# Patient Record
Sex: Male | Born: 1935 | Race: White | Hispanic: No | Marital: Married | Smoking: Former smoker
Health system: Southern US, Community
[De-identification: ages and names within clinical notes are randomized; demographics above are authoritative.]

## PROBLEM LIST (undated history)

## (undated) DIAGNOSIS — M1990 Unspecified osteoarthritis, unspecified site: Secondary | ICD-10-CM

## (undated) DIAGNOSIS — I70209 Unspecified atherosclerosis of native arteries of extremities, unspecified extremity: Secondary | ICD-10-CM

## (undated) DIAGNOSIS — I119 Hypertensive heart disease without heart failure: Secondary | ICD-10-CM

## (undated) DIAGNOSIS — D649 Anemia, unspecified: Secondary | ICD-10-CM

## (undated) DIAGNOSIS — E119 Type 2 diabetes mellitus without complications: Secondary | ICD-10-CM

## (undated) DIAGNOSIS — R0602 Shortness of breath: Secondary | ICD-10-CM

## (undated) DIAGNOSIS — I509 Heart failure, unspecified: Secondary | ICD-10-CM

## (undated) DIAGNOSIS — H3530 Unspecified macular degeneration: Secondary | ICD-10-CM

## (undated) DIAGNOSIS — I219 Acute myocardial infarction, unspecified: Secondary | ICD-10-CM

## (undated) DIAGNOSIS — N184 Chronic kidney disease, stage 4 (severe): Secondary | ICD-10-CM

## (undated) DIAGNOSIS — L98499 Non-pressure chronic ulcer of skin of other sites with unspecified severity: Secondary | ICD-10-CM

## (undated) DIAGNOSIS — I2510 Atherosclerotic heart disease of native coronary artery without angina pectoris: Secondary | ICD-10-CM

## (undated) DIAGNOSIS — K635 Polyp of colon: Secondary | ICD-10-CM

## (undated) DIAGNOSIS — E785 Hyperlipidemia, unspecified: Secondary | ICD-10-CM

## (undated) DIAGNOSIS — E118 Type 2 diabetes mellitus with unspecified complications: Secondary | ICD-10-CM

## (undated) HISTORY — DX: Atherosclerotic heart disease of native coronary artery without angina pectoris: I25.10

## (undated) HISTORY — PX: CAROTID ENDARTERECTOMY: SUR193

## (undated) HISTORY — PX: HERNIA REPAIR: SHX51

## (undated) HISTORY — DX: Anemia, unspecified: D64.9

## (undated) HISTORY — DX: Unspecified macular degeneration: H35.30

## (undated) HISTORY — PX: OTHER SURGICAL HISTORY: SHX169

## (undated) HISTORY — DX: Unspecified osteoarthritis, unspecified site: M19.90

## (undated) HISTORY — DX: Polyp of colon: K63.5

## (undated) HISTORY — DX: Hyperlipidemia, unspecified: E78.5

---

## 1990-12-31 ENCOUNTER — Encounter (INDEPENDENT_AMBULATORY_CARE_PROVIDER_SITE_OTHER): Payer: Self-pay | Admitting: *Deleted

## 1991-01-17 ENCOUNTER — Encounter (INDEPENDENT_AMBULATORY_CARE_PROVIDER_SITE_OTHER): Payer: Self-pay | Admitting: *Deleted

## 1991-11-29 HISTORY — PX: CORONARY ARTERY BYPASS GRAFT: SHX141

## 1997-07-15 ENCOUNTER — Encounter (INDEPENDENT_AMBULATORY_CARE_PROVIDER_SITE_OTHER): Payer: Self-pay | Admitting: *Deleted

## 2002-05-27 ENCOUNTER — Encounter: Payer: Self-pay | Admitting: Internal Medicine

## 2002-06-03 ENCOUNTER — Encounter: Payer: Self-pay | Admitting: Internal Medicine

## 2003-02-20 ENCOUNTER — Ambulatory Visit (HOSPITAL_COMMUNITY): Admission: RE | Admit: 2003-02-20 | Discharge: 2003-02-20 | Payer: Self-pay | Admitting: General Surgery

## 2003-02-20 ENCOUNTER — Encounter (INDEPENDENT_AMBULATORY_CARE_PROVIDER_SITE_OTHER): Payer: Self-pay | Admitting: *Deleted

## 2003-02-20 ENCOUNTER — Encounter (INDEPENDENT_AMBULATORY_CARE_PROVIDER_SITE_OTHER): Payer: Self-pay | Admitting: Specialist

## 2003-07-03 ENCOUNTER — Ambulatory Visit (HOSPITAL_COMMUNITY): Admission: RE | Admit: 2003-07-03 | Discharge: 2003-07-03 | Payer: Self-pay | Admitting: Cardiology

## 2003-07-03 ENCOUNTER — Encounter: Payer: Self-pay | Admitting: Cardiology

## 2003-07-10 ENCOUNTER — Ambulatory Visit (HOSPITAL_COMMUNITY): Admission: RE | Admit: 2003-07-10 | Discharge: 2003-07-11 | Payer: Self-pay | Admitting: Cardiology

## 2004-01-14 ENCOUNTER — Ambulatory Visit (HOSPITAL_COMMUNITY): Admission: RE | Admit: 2004-01-14 | Discharge: 2004-01-14 | Payer: Self-pay | Admitting: Cardiology

## 2004-01-14 ENCOUNTER — Encounter (INDEPENDENT_AMBULATORY_CARE_PROVIDER_SITE_OTHER): Payer: Self-pay | Admitting: *Deleted

## 2004-03-12 ENCOUNTER — Ambulatory Visit (HOSPITAL_COMMUNITY): Admission: RE | Admit: 2004-03-12 | Discharge: 2004-03-13 | Payer: Self-pay | Admitting: Nephrology

## 2005-03-02 ENCOUNTER — Ambulatory Visit: Payer: Self-pay | Admitting: Internal Medicine

## 2005-03-05 ENCOUNTER — Encounter (INDEPENDENT_AMBULATORY_CARE_PROVIDER_SITE_OTHER): Payer: Self-pay | Admitting: *Deleted

## 2005-03-05 ENCOUNTER — Ambulatory Visit (HOSPITAL_COMMUNITY): Admission: RE | Admit: 2005-03-05 | Discharge: 2005-03-05 | Payer: Self-pay | Admitting: Internal Medicine

## 2005-03-29 ENCOUNTER — Ambulatory Visit: Payer: Self-pay | Admitting: Internal Medicine

## 2007-01-25 ENCOUNTER — Encounter: Admission: RE | Admit: 2007-01-25 | Discharge: 2007-01-25 | Payer: Self-pay | Admitting: Nephrology

## 2008-03-22 ENCOUNTER — Emergency Department (HOSPITAL_COMMUNITY): Admission: EM | Admit: 2008-03-22 | Discharge: 2008-03-22 | Payer: Self-pay | Admitting: Emergency Medicine

## 2009-08-17 ENCOUNTER — Emergency Department (HOSPITAL_COMMUNITY): Admission: EM | Admit: 2009-08-17 | Discharge: 2009-08-17 | Payer: Self-pay | Admitting: Emergency Medicine

## 2009-09-02 ENCOUNTER — Encounter: Admission: RE | Admit: 2009-09-02 | Discharge: 2009-09-02 | Payer: Self-pay | Admitting: Nephrology

## 2009-10-27 ENCOUNTER — Encounter (INDEPENDENT_AMBULATORY_CARE_PROVIDER_SITE_OTHER): Payer: Self-pay | Admitting: *Deleted

## 2009-12-01 ENCOUNTER — Ambulatory Visit: Payer: Self-pay | Admitting: Internal Medicine

## 2009-12-01 DIAGNOSIS — R197 Diarrhea, unspecified: Secondary | ICD-10-CM

## 2009-12-02 ENCOUNTER — Ambulatory Visit: Payer: Self-pay | Admitting: Vascular Surgery

## 2009-12-03 DIAGNOSIS — L02619 Cutaneous abscess of unspecified foot: Secondary | ICD-10-CM | POA: Insufficient documentation

## 2009-12-03 DIAGNOSIS — L03119 Cellulitis of unspecified part of limb: Secondary | ICD-10-CM

## 2009-12-09 ENCOUNTER — Ambulatory Visit: Payer: Self-pay | Admitting: Vascular Surgery

## 2010-12-30 NOTE — Procedures (Signed)
Summary: Colonoscopy   Colonoscopy  Procedure date:  03/29/2005  Findings:      Pathology:  Adenomatous polyp.        Location:  [facility].    Colonoscopy  Procedure date:  03/29/2005  Findings:      Pathology:  Adenomatous polyp.        Location:  [facility].   Patient Name: Whitney, Bingaman MRN:  Procedure Procedures: Colonoscopy CPT: 16109.    with polypectomy. CPT: A3573898.  Personnel: Endoscopist: Wilhemina Bonito. Marina Goodell, MD.  Exam Location: Exam performed in Outpatient Clinic. Outpatient  Patient Consent: Procedure, Alternatives, Risks and Benefits discussed, consent obtained, from patient. Consent was obtained by the RN.  Indications  Abnormal Exams, Studies: CT scan,  Surveillance of: POLYPS.  History  Current Medications: Patient is not currently taking Coumadin.  Pre-Exam Physical: Performed Jun 03, 2002. Entire physical exam was normal.  Exam Exam: Extent of exam reached: Cecum, extent intended: Cecum.  The cecum was identified by appendiceal orifice and IC valve. Patient position: on left side. Colon retroflexion performed. Images taken. ASA Classification: II. Tolerance: excellent.  Monitoring: Pulse and BP monitoring, Oximetry used. Supplemental O2 given.  Colon Prep Used MIRALAX for colon prep. Prep results: good.  Sedation Meds: Patient assessed and found to be appropriate for moderate (conscious) sedation. Fentanyl 50 mcg. given IV. Versed 5 mg. given IV.  Findings NORMAL EXAM: Cecum to Rectum.  MULTIPLE POLYPS: Ascending Colon to Transverse Colon. minimum size 2 mm, maximum size 4 mm. Procedure:  snare without cautery, removed, Polyp retrieved, 1 polyps Polyps sent to pathology. ICD9: Colon Polyps: 211.3. Comments: 3 seen and removed.  POLYP: Sigmoid Colon, Maximum size: 5 mm. Procedure:  snare without cautery, removed, retrieved, ICD9: Colon Polyps: 211.3.   Assessment Abnormal examination, see findings above.    Diagnoses: 211.3: Colon Polyps.   Comments: NO WORRISOME LESIONS FOUND Events  Unplanned Interventions: No intervention was required.  Unplanned Events: There were no complications. Plans Disposition: After procedure patient sent to recovery. After recovery patient sent home.  Scheduling/Referral: Follow-Up prn.   This report was created from the original endoscopy report, which was reviewed and signed by the above listed endoscopist.   cc: Geoffry Paradise, MD     The Patient

## 2010-12-30 NOTE — Consult Note (Signed)
Summary: GI Consult/[facility]  GI Consult/ HealthCare   Imported By: Sherian Rein 12/07/2009 09:49:19  _____________________________________________________________________  External Attachment:    Type:   Image     Comment:   External Document

## 2010-12-30 NOTE — Assessment & Plan Note (Signed)
Summary: CONTINUE DIARRHEA..EM   History of Present Illness Visit Type: Initial Consult Primary GI MD: Yancey Flemings MD Primary Provider: Geoffry Paradise, MD Requesting Provider: Geoffry Paradise, MD Chief Complaint: diarrhea and fecal incontinence for 2 months, corrected with align and symptoms have subsided History of Present Illness:   77 year old white male with multiple medical problems including coronary artery disease status post coronary artery bypass grafting, carotid artery disease status post carotid endarterectomy, diabetes mellitus,renovascular disease with renal stents, hyperlipidemia, osteoarthritis, and adenomatous colon polyps. The patient presents today after having had recent problems with diarrhea. He is accompanied by his wife. He tells me that his chronic medical problems have been stable. However, around September, he began to have loose stools. Occasionally unexpected stools with urgency and the risk of incontinence. He was placed on the probiotic align for several weeks without any improvement. In late November he was placed on metronidazole for 10 days. After completing that therapy, his bowel habits return to normal. He has had normal bowel movements since mid December. He denies recent history of antibiotic exposure. No exposure to persons with similar illness or exotic travel. No stool studies performed. Review of outside laboratories from September reveals a normal CBC and comprehensive metabolic panel as well as urinalysis. Prior upper endoscopy in May of 2006 was normal prior colonoscopy in May of 2006 revealed diminutive polyps only. The only other complaint is that of a red right foot of one month's duration. No pain.Marland Kitchen   GI Review of Systems      Denies abdominal pain, acid reflux, belching, bloating, chest pain, dysphagia with liquids, dysphagia with solids, heartburn, loss of appetite, nausea, vomiting, vomiting blood, weight loss, and  weight gain.      Reports  change in bowel habits and  diarrhea.     Denies anal fissure, black tarry stools, constipation, diverticulosis, fecal incontinence, heme positive stool, hemorrhoids, irritable bowel syndrome, jaundice, light color stool, liver problems, rectal bleeding, and  rectal pain. Preventive Screening-Counseling & Management  Alcohol-Tobacco     Smoking Status: quit      Drug Use:  no.      Current Medications (verified): 1)  Amlodipine Besylate 10 Mg Tabs (Amlodipine Besylate) .... Once Daily 2)  Plavix 75 Mg Tabs (Clopidogrel Bisulfate) .Marland Kitchen.. 1 Tablet By Mouth Once Daily 3)  Tricor 145 Mg Tabs (Fenofibrate) .Marland Kitchen.. 1 Tablet By Mouth Once Daily 4)  Furosemide 20 Mg Tabs (Furosemide) .Marland Kitchen.. 1 Tablet By Mouth Once Daily 5)  Lexapro 10 Mg Tabs (Escitalopram Oxalate) .... Once Daily 6)  Ambien Cr 12.5 Mg Cr-Tabs (Zolpidem Tartrate) .... At Bedtime As Needed For Sleep 7)  Betimol 0.5 % Soln (Timolol) .... Use As Directed 8)  Aspirin 81 Mg Tbec (Aspirin) .Marland Kitchen.. 1 Tablet By Mouth Once Daily 9)  Novolog Mix 70/30 70-30 % Susp (Insulin Aspart Prot & Aspart) .... Inject 30 Units  Every Morning and 28 Units Every Evening 10)  Humalog 100 Unit/ml Soln (Insulin Lispro (Human)) .... Inject 8 Units At Lunchtime 11)  Allopurinol 100 Mg Tabs (Allopurinol) .... Once Daily 12)  Propoxacet-N 100-650 Mg Tabs (Propoxyphene N-Apap) .... As Needed 13)  Zemplar 1 Mcg Caps (Paricalcitol) .... Once Daily 14)  Icaps  Caps (Multiple Vitamins-Minerals) .... Take 2 Tablets Daily  Allergies (verified): No Known Drug Allergies  Past History:  Past Medical History: Reviewed history from 11/30/2009 and no changes required. Adenomatous Colon Polyps Chronic Kidney Disease Coronary Artery Disease Diabetes Osteoarthritis Hyperlipidemia  Past Surgical History: Reviewed history from  11/30/2009 and no changes required. CABG Rt. Carotid Endarterectomy  Family History: Family History of Diabetes: Brother Family History of Heart  Disease: Father?  Social History: Occupation: Retired Patient is a former smoker.  Alcohol Use - no Daily Caffeine Use Illicit Drug Use - no Smoking Status:  quit Drug Use:  no  Review of Systems       hearing problems, sleeping problems, excessive thirst, shortness of breath, fatigue. All other systems reviewed and reported as negative  Vital Signs:  Patient profile:   77 year old male Height:      72 inches Weight:      173 pounds BMI:     23.55 Pulse rate:   64 / minute Pulse rhythm:   regular BP sitting:   114 / 56  (left arm) Cuff size:   regular  Vitals Entered By: June McMurray CMA Duncan Dull) (December 01, 2009 9:35 AM)  Physical Exam  General:  Well developed, well nourished, no acute distress. Head:  Normocephalic and atraumatic. Eyes:  PERRLA, no icterus. Nose:  No deformity, discharge,  or lesions. Mouth:  No deformity or lesions,  Neck:  Supple; no masses or thyromegaly. Lungs:  Clear throughout to auscultation. Heart:  Regular rate and rhythm; no murmurs, rubs,  or bruits. Abdomen:  Soft, nontender and nondistended. No masses, hepatosplenomegaly or hernias noted. Normal bowel sounds. Msk:  hammertoes. Normal posture. Pulses:  Normal pulses noted. Extremities:  No clubbing, cyanosis, edema or deformities noted.. The right foot is erythematous and warm consistent with cellulitis. No tenderness Neurologic:  Alert and  oriented x4;  grossly normal neurologically. Skin:  Intact without significant lesions or rashes except for the skin over the right foot as described Psych:  Alert and cooperative. Normal mood and affect.   Impression & Recommendations:  Problem # 1:  DIARRHEA (ICD-787.91) the patient had previous problems with several weeks of diarrhea and occasional incontinence. He seems to have responded to a course of metronidazole. Unclear if that was cause and effect or coincidental. As such, I suspect that his diarrhea was related to either a self-limited  viral gastroenteritis or a metronidazole responsive disorder such as Giardia or community acquired C.difficile. In any event, he has been asymptomatic for 3 weeks. At this point, further needs to be done. We will observe him for any evidence of recurrence. If recurrence occurs, then consider additional workup. He will return to the care of Dr. Jacky Kindle.  Problem # 2:  CELLULITIS, FOOT, RIGHT (ICD-682.7) patient appears to have a cellulitis on his right foot. I would like him to see Dr. Jacky Kindle this week. There are agreeable we'll contact the office today.  Patient Instructions: 1)  Please schedule a follow-up appointment as needed.  2)  The medication list was reviewed and reconciled.  All changed / newly prescribed medications were explained.  A complete medication list was provided to the patient / caregiver. 3)  Copy: Dr. Geoffry Paradise

## 2010-12-30 NOTE — Procedures (Signed)
Summary: Colonoscopy/[facility]  Colonoscopy/[facility]   Imported By: Sherian Rein 12/07/2009 09:43:21  _____________________________________________________________________  External Attachment:    Type:   Image     Comment:   External Document

## 2010-12-30 NOTE — Progress Notes (Signed)
Summary: [facility]   Imported By: Sherian Rein 12/07/2009 09:47:05  _____________________________________________________________________  External Attachment:    Type:   Image     Comment:   External Document

## 2010-12-30 NOTE — Procedures (Signed)
Summary: Colonoscopy   Colonoscopy  Procedure date:  06/03/2002  Findings:      Results: Normal. Location:  [facility].   Patient Name: [name], [name] MRN:  Procedure Procedures: Colonoscopy CPT: 04540.  Personnel: Endoscopist: Wilhemina Bonito. Marina Goodell, MD.  Referred By: Pearletha Furl Jacky Kindle, MD.  Exam Location: Exam performed in Outpatient Clinic. Outpatient  Patient Consent: Procedure, Alternatives, Risks and Benefits discussed, consent obtained, from patient. Consent was obtained by the RN.  Indications  Surveillance of: polyps.  History  Pre-Exam Physical: Performed Jun 03, 2002. Entire physical exam was normal.  Exam Exam: Extent of exam reached: Terminal Ileum, extent intended: Terminal Ileum.  The cecum was identified by appendiceal orifice and IC valve. Patient position: on left side. Colon retroflexion performed. Images taken. ASA Classification: III. Tolerance: excellent.  Monitoring: Pulse and BP monitoring, Oximetry used. Supplemental O2 given.  Colon Prep Used Golytely for colon prep. Prep results: excellent.  Sedation Meds: Patient assessed and found to be appropriate for moderate (conscious) sedation. Fentanyl 50 mcg. given IV. Versed 3 mg. given IV.  Findings NORMAL EXAM: Ileum to Rectum.   Assessment Normal examination.  Events  Unplanned Interventions: No intervention was required.  Unplanned Events: There were no complications. Plans Disposition: After procedure patient sent to recovery. After recovery patient sent home.  Scheduling/Referral: Follow-Up prn.  Comments: Return to the care of Dr. Jacky Kindle.  This report was created from the original endoscopy report, which was reviewed and signed by the above listed endoscopist.   cc:  Geoffry Paradise, MD

## 2010-12-30 NOTE — Op Note (Signed)
Summary: Operative Report    NAME:  [name], [name]                           ACCOUNT NO.:  [id number]   MEDICAL RECORD NO.:  [id number]                   PATIENT TYPE:  AMB   LOCATION:  DAY                                  FACILITY:  [facility]   PHYSICIAN:  Timothy E. Earlene Plater, M.D.              DATE OF BIRTH:  1927/10/17   DATE OF PROCEDURE:  02/20/2003  DATE OF DISCHARGE:                                 OPERATIVE REPORT   PREOPERATIVE DIAGNOSIS:  Right inguinal hernia.   POSTOPERATIVE DIAGNOSIS:  Right indirect inguinal hernia.   OPERATIVE PROCEDURE:  Repair of hernia with mesh.   SURGEON:  Timothy E. Earlene Plater, M.D.   ANESTHESIA:  General.   INDICATIONS FOR PROCEDURE:  Mr. Bee is 46, has significant but controlled  diabetes and cardiovascular disease.  He has an increasing, uncomfortable,  larger, right inguinal hernia and after careful discussion, he wishes to  have the repair.  He was seen, identified, the site marked, and the permit  signed.  He was evaluated by anesthesia.   DESCRIPTION OF PROCEDURE:  He was taken to the operating room, placed  supine, LMA anesthesia provided.  The right groin had been shaved.  It was  prepped and draped in the usual fashion.  Marcaine 25% with epinephrine was  used throughout the wound.  A horizontal incision made in the right groin,  the subcutaneous tissue dissected and retracted, the external oblique  identified, opened in line with its fibers to the external ring.  This too,  was retracted.  The cord structures were quite large, were dissected from  the floor of the canal.  The floor of the canal was weak but not herniated.  The cord contained a large indirect hernia sac.  There were no contents at  this time.  The sac was dissected from the cord structures back to its neck  at the internal ring, was ligated with a 0 Prolene, excess sac cut away and  submitted for path.  A plug of mesh was placed in the internal ring, sutured  to the  surrounding fascia.  A patch of mesh was placed over the floor of the  canal, under the cord, and around the ring, and sewn with 2-0 running  Prolene.  This completed the repair.  The cord was replaced in its anatomic  position.  Bleeding had been controlled.  The external oblique was closed  with a running 2-0 Vicryl.  Deep subcu 2-0 Vicryl,  skin 3-0 Monocryl.  Steri-Strips and dry sterile dressing applied, counts  correct.  He tolerated it well, was awakened, and taken to the recovery room  in good condition.   Written and verbal instructions including Vicodin #36 were given, and he  will be seen and followed as an outpatient.  Timothy E. Earlene Plater, M.D.    TED/MEDQ  D:  02/20/2003  T:  02/20/2003  Job:  [id number]   cc:   Geoffry Paradise, M.D.  [address]  [place]  [place] [address]  Fax: [phone]

## 2010-12-30 NOTE — Progress Notes (Signed)
Summary: GI Dietitian  GI Office Note/[facility]   Imported By: Sherian Rein 12/07/2009 09:51:29  _____________________________________________________________________  External Attachment:    Type:   Image     Comment:   External Document

## 2010-12-30 NOTE — Procedures (Signed)
Summary: EGD   EGD  Procedure date:  03/29/2005  Findings:      Findings: Normal    EGD  Procedure date:  03/29/2005  Findings:      Findings: Normal   Patient Name: Ozro, Russett MRN:  Procedure Procedures: Panendoscopy (EGD) CPT: 43235.  Personnel: Endoscopist: Wilhemina Bonito. Marina Goodell, MD.  Exam Location: Exam performed in Outpatient Clinic. Outpatient  Patient Consent: Procedure, Alternatives, Risks and Benefits discussed, consent obtained, from patient. Consent was obtained by the RN.  Indications Symptoms: Abdominal pain,  History  Current Medications: Patient is not currently taking Coumadin.  Pre-Exam Physical: Performed Mar 29, 2005  Entire physical exam was normal.  Exam Exam Info: Maximum depth of insertion Duodenum, intended Duodenum. Patient position: on left side. Vocal cords visualized. Gastric retroflexion performed. Images taken. ASA Classification: II. Tolerance: excellent.  Sedation Meds: Patient assessed and found to be appropriate for moderate (conscious) sedation. Residual sedation present from prior procedure today. Fentanyl 25 mcg. given IV. Versed 2 mg. given IV.  Monitoring: BP and pulse monitoring done. Oximetry used. Supplemental O2 given  Findings Normal: Proximal Esophagus to Duodenal 2nd Portion. Comments: SMALL H/H; J SHAPED STOMACH.   Assessment Normal examination.  Events  Unplanned Intervention: No unplanned interventions were required.  Unplanned Events: There were no complications. Plans Disposition: After procedure patient sent to recovery. After recovery patient sent home.  Scheduling: Follow-up prn.  Comments: RETURN TO THE CARE OF DR ARONSON  This report was created from the original endoscopy report, which was reviewed and signed by the above listed endoscopist.   cc:  Geoffry Paradise, MD      The Patient

## 2011-03-04 LAB — CBC
Hemoglobin: 13.4 g/dL (ref 13.0–17.0)
MCHC: 34.5 g/dL (ref 30.0–36.0)
RBC: 3.97 MIL/uL — ABNORMAL LOW (ref 4.22–5.81)

## 2011-03-04 LAB — GLUCOSE, CAPILLARY
Glucose-Capillary: 120 mg/dL — ABNORMAL HIGH (ref 70–99)
Glucose-Capillary: 179 mg/dL — ABNORMAL HIGH (ref 70–99)

## 2011-03-04 LAB — POCT I-STAT, CHEM 8
BUN: 62 mg/dL — ABNORMAL HIGH (ref 6–23)
Calcium, Ion: 1.12 mmol/L (ref 1.12–1.32)
Chloride: 103 mEq/L (ref 96–112)
Glucose, Bld: 58 mg/dL — ABNORMAL LOW (ref 70–99)

## 2011-03-04 LAB — URINALYSIS, ROUTINE W REFLEX MICROSCOPIC
Nitrite: NEGATIVE
Specific Gravity, Urine: 1.011 (ref 1.005–1.030)
Urobilinogen, UA: 0.2 mg/dL (ref 0.0–1.0)

## 2011-03-04 LAB — DIFFERENTIAL
Lymphocytes Relative: 13 % (ref 12–46)
Monocytes Absolute: 0.7 10*3/uL (ref 0.1–1.0)
Monocytes Relative: 10 % (ref 3–12)
Neutro Abs: 5.3 10*3/uL (ref 1.7–7.7)

## 2011-03-04 LAB — POCT CARDIAC MARKERS: Troponin i, poc: <0.05 ng/mL (ref 0.00–0.09)

## 2011-04-12 NOTE — Assessment & Plan Note (Signed)
OFFICE VISIT   [name], [name]  DOB:  [birth year]                                       12/02/2009  [id number]#:[id number]   CHIEF COMPLAINT:  Redness in right foot.   HISTORY OF PRESENT ILLNESS:  The patient is an 77 year old male referred  by Dr. Jacky Kindle for evaluation of redness in his right foot.  This has  been present for approximately 2 weeks.  The patient has a history of  gout in the past and he states it  looked similar to this but this time  it is not painful and in the past, his gout had been painful.  He does  not describe any claudication symptoms.  He has no history of nonhealing  wounds on his feet.  He has no history of rest pain.   CHRONIC MEDICAL PROBLEMS:  Include coronary artery disease, diabetes and  hypertension.  All these are currently well controlled.   Of note, he has been on Plavix for several years.   PAST SURGICAL HISTORY:  Coronary artery bypass grafting.  He had  coronary stenting by Dr. Donnie Aho several years ago.   PAST MEDICAL HISTORY:  Otherwise is as listed above.   FAMILY HISTORY:  Unremarkable.   SOCIAL HISTORY:  He is married and has 4 children.  He is retired.  He  is a former smoker, quit 30 years ago.   A full 12 point review of systems was performed with the patient.  Please see intake referral form for details regarding this.   PHYSICAL EXAMINATION:  Vital signs:  Blood pressure 128/68  in the left  arm, heart rate 62 and regular, respirations 16.  HEENT:  Unremarkable.  Neck:  Has 2+ carotid pulses without bruit.  Chest:  Clear to  auscultation.  Heart:  Regular rate rhythm without murmur.  Abdomen:  Soft, nontender, nondistended.  No masses.  Extremities:  He has 2+  radial, 1+ femoral pulses bilaterally.  He has absent popliteal and  pedal pulses bilaterally.  He has well-healed saphenectomy scars in the  calf area on both legs.  He has a well-healed right neck carotid  endarterectomy scar.  Neurologic:  Exam  shows symmetric upper extremity  and lower extremity motor strength with no focal deficit.  Musculoskeletal:  He has no major joint deformities.  Skin:  Has no  ulcers or rashes.  However, there is some erythema over the right first  metatarsal area extending onto the dorsum of the foot.  There is some  pain on palpation of the first metatarsal head in the right forefoot.   He has no significant lymphadenopathy in the groin or cervical area.  The area of erythema on his foot is approximately 7 x 5 cm in diameter.   The patient had bilateral ABIs today which were 0.58 on the right and  1.04 on the left.  Waveforms were monophasic on the right, biphasic on  the left.  Duplex ultrasound showed a greater than 75% stenosis of the  right superficial femoral artery.   In summary, the patient has history of erythema in his right foot that  is not really  painful except on palpation.  He currently has been  placed on Keflex and colchicine by Dr. Jacky Kindle  for a possible gout  flare-up.  I agree with Dr. Jacky Kindle that certainly could  be the case.  However, I believe he warrants close follow-up and  I have scheduled him  for an office visit next week to make sure that his symptoms overall  were improving.  If not we can consider an arteriogram to further  evaluate his right lower extremity arterial tree.  If this heals  spontaneously I do not believe he needs further intervention since  essentially he is asymptomatic from his lower extremities except for  this area of erythema.     Janetta Hora. Fields, MD  Electronically Signed   CEF/MEDQ  D:  12/04/2009  T:  12/04/2009  Job:  [id number]   cc:   Geoffry Paradise, M.D.

## 2011-04-12 NOTE — Procedures (Signed)
LOWER EXTREMITY ARTERIAL DUPLEX   INDICATION:  Right lower extremity pain and redness.   HISTORY:  Diabetes:  Yes.  Cardiac:  Yes.  Hypertension:  No.  Smoking:  Quit about 30 years.  Previous Surgery:   SINGLE LEVEL ARTERIAL EXAM                          RIGHT                LEFT  Brachial:               132                  135  Anterior tibial:        67                   141  Posterior tibial:       78                   78  Peroneal:  Ankle/Brachial Index:   0.58                 1.04   LOWER EXTREMITY ARTERIAL DUPLEX EXAM   DUPLEX:  High-grade stenosis noted in the mid-to-distal thigh with  questionable occlusion at the distal thigh in the right leg.   IMPRESSION:  1. High-grade stenosis noted at the mid-to-distal thigh of the right      leg with questionable occlusion at the distal thigh to the      popliteal area.  2. Moderately abnormal ankle brachial index with monophasic Doppler      waveforms noted in the right leg.  3. Normal ankle brachial index with biphasic Doppler waveforms noted      in the left dorsalis pedis artery.   ___________________________________________  Janetta Hora Fields, MD   MG/MEDQ  D:  12/02/2009  T:  12/02/2009  Job:  [id number]

## 2011-04-12 NOTE — Assessment & Plan Note (Signed)
OFFICE VISIT   [name], [name]  DOB:  Feb 28, 1927                                       12/09/2009  [id number]#:[id number]   The patient returns for followup today.  He was seen 1 week ago with  erythema and tenderness over his left first metatarsal head.  He  presents today for further followup.  He is currently on Keflex.  He  also takes chronically allopurinol.  He apparently is now off  colchicine.   He states he has had no fevers.  He states that overall the foot has  less pain.  He states in the last few days the redness has started to  resolve.   PHYSICAL EXAM:  Temperature is 97.8, blood pressure 115/65 in the left  arm, heart rate is 83 and regular.  The right foot has decreased  erythema.  It is not as intense in character.  He has no tenderness  whatsoever over the metatarsal head at this point.   Overall the patient has improved significantly.  I believe that most  likely this represented a gout flareup rather than necessarily an  infectious process just because the tenderness in the metatarsal head  has resolved so quickly.  He will follow up with [place] in six months' time  for repeat ABIs or sooner if the foot begins to worsen again.     Janetta Hora. Fields, MD  Electronically Signed   CEF/MEDQ  D:  12/09/2009  T:  12/10/2009  Job:  [id number]   cc:   Geoffry Paradise, M.D.

## 2011-04-15 NOTE — Cardiovascular Report (Signed)
[name], [name]                           ACCOUNT NO.:  [id number]   MEDICAL RECORD NO.:  [id number]                   PATIENT TYPE:  OIB   LOCATION:  2901                                 FACILITY:  [facility]   PHYSICIAN:  W. Ashley Royalty., M.D.         DATE OF BIRTH:  [birth year]   DATE OF PROCEDURE:  07/10/2003  DATE OF DISCHARGE:                              CARDIAC CATHETERIZATION   HISTORY:  A 77 year old male with history of coronary artery bypass grafting  who had a severe ostial stenosis noted in the graft to the right coronary  artery which was a sequential graft.  After consideration of options with  patent internal mammary grafts and a diseased vein graft to the obtuse  marginal, it was opted to go with angioplasty and stenting of the vein graft  to the right coronary artery.   DESCRIPTION OF PROCEDURE:  The patient was brought to the catheterization  laboratory and was prepped and draped in the usual manner.  He had been  premedicated with Mucomyst and hydration.  A 7French sheath was placed in  his right femoral artery percutaneously.  Angiograms were made using a 7  [place] JR-4 bypass catheter. Angiomax was administered with bolus and  infusion.  I initially tried to place the filter wire down the artery, but  could not get the vein shaft to the filter wire to pass.  A 0.014 guide wire  was passed across the lesion and the lesion was predominant with a 2.0 x 15-  mm CrossSail balloon.  The filter wire was then able to be deployed in a  good position.  There was some slight malposition due to the large size of  the vein graft.  A 5.0 x 18-mm Ultra stent was then deployed trying to get  the stent outside the ostium.  When the stent was deployed, it slipped back  just a little bit.  There was some under expansion of the stent balloon at  the lesion and the lesion was post dilated with a 5.0 Quantum Maverick to 18  atmospheres with good expansion.  There was some  residual eccentric stenosis  noted in the RAO view, but it was elected with good lesion caliber to leave  this alone.  He did have some bradycardia hypotension after the final  balloon inflations.  The filter wire was removed, nitroglycerin was stopped.  He was begun on some dopamine and nitroglycerin.  His blood pressure  improved and his TIMI flow improved following this.  Post dilatation  angiograms were made and the sheath was sutured in place and he was returned  to the transitional care unit in stable condition.   ANGIOGRAPHIC DATA:  Right coronary graft pre dilatation shows an eccentric  severe 99% ostial stenosis followed by severe 90% proximal stenosis.  The  body of the graft has irregularity and has three termination sites in the  acute marginal,  posterior descending and posterior lateral branch.  Post  dilatation angiograms reveal 20% stenosis which is eccentric.    IMPRESSION:  Successful stenting of the vein graft to the right coronary  artery using a filter wire with stenosis going from 99% to 90% to 20%.                                                [name]., M.D.    WST/MEDQ  D:  07/10/2003  T:  07/10/2003  Job:  [id number]   cc:   Geoffry Paradise, M.D.  [address]  [place]  [place] [address]  Fax: [phone]

## 2011-04-15 NOTE — Op Note (Signed)
[name], [name]                           ACCOUNT NO.:  [id number]   MEDICAL RECORD NO.:  [id number]                   PATIENT TYPE:  AMB   LOCATION:  DAY                                  FACILITY:  [facility]   PHYSICIAN:  Timothy E. Earlene Plater, M.D.              DATE OF BIRTH:  [birth year]   DATE OF PROCEDURE:  02/20/2003  DATE OF DISCHARGE:                                 OPERATIVE REPORT   PREOPERATIVE DIAGNOSIS:  Right inguinal hernia.   POSTOPERATIVE DIAGNOSIS:  Right indirect inguinal hernia.   OPERATIVE PROCEDURE:  Repair of hernia with mesh.   SURGEON:  Timothy E. Earlene Plater, M.D.   ANESTHESIA:  General.   INDICATIONS FOR PROCEDURE:  [name] is 48, has significant but controlled  diabetes and cardiovascular disease.  He has an increasing, uncomfortable,  larger, right inguinal hernia and after careful discussion, he wishes to  have the repair.  He was seen, identified, the site marked, and the permit  signed.  He was evaluated by anesthesia.   DESCRIPTION OF PROCEDURE:  He was taken to the operating room, placed  supine, LMA anesthesia provided.  The right groin had been shaved.  It was  prepped and draped in the usual fashion.  Marcaine 25% with epinephrine was  used throughout the wound.  A horizontal incision made in the right groin,  the subcutaneous tissue dissected and retracted, the external oblique  identified, opened in line with its fibers to the external ring.  This too,  was retracted.  The cord structures were quite large, were dissected from  the floor of the canal.  The floor of the canal was weak but not herniated.  The cord contained a large indirect hernia sac.  There were no contents at  this time.  The sac was dissected from the cord structures back to its neck  at the internal ring, was ligated with a 0 Prolene, excess sac cut away and  submitted for path.  A plug of mesh was placed in the internal ring, sutured  to the surrounding fascia.  A patch of  mesh was placed over the floor of the  canal, under the cord, and around the ring, and sewn with 2-0 running  Prolene.  This completed the repair.  The cord was replaced in its anatomic  position.  Bleeding had been controlled.  The external oblique was closed  with a running 2-0 Vicryl.  Deep subcu 2-0 Vicryl,  skin 3-0 Monocryl.  Steri-Strips and dry sterile dressing applied, counts  correct.  He tolerated it well, was awakened, and taken to the recovery room  in good condition.   Written and verbal instructions including Vicodin #36 were given, and he  will be seen and followed as an outpatient.  Timothy E. Earlene Plater, M.D.    TED/MEDQ  D:  02/20/2003  T:  02/20/2003  Job:  [id number]   cc:   Geoffry Paradise, M.D.  [address]  [place]  [place] [address]  Fax: [phone]

## 2011-04-15 NOTE — Cardiovascular Report (Signed)
[name], [name]                           ACCOUNT NO.:  [id number]   MEDICAL RECORD NO.:  [id number]                   PATIENT TYPE:  OIB   LOCATION:  2852                                 FACILITY:  [facility]   PHYSICIAN:  [name]., M.D.         DATE OF BIRTH:  [birth year]   DATE OF PROCEDURE:  07/03/2003  DATE OF DISCHARGE:                              CARDIAC CATHETERIZATION   HISTORY:  This is a 77 year old male with previous coronary artery bypass  graft who has an abnormal Cardiolite scan and who has also had chest  discomfort.  Study is done to evaluate graft patency.   PROCEDURE:  Procedure was done through the right groin percutaneously  through a single anterior wall needle stick.  Grafts were selected using  right coronary catheter for the vein grafts and internal mammary artery  catheters required to select the internal mammary graft.  He tolerated the  procedure well.  He received labetalol 20 mg IV at the end of the case for  blood pressure control.   HEMODYNAMIC DATA:  1. Aorta post contrast 174/77.  2. LV post contrast 174/13-20.   ANGIOGRAPHIC DATA:  LEFT VENTRICULOGRAM: Performed in the 30 degree RAO  projection.  The aortic valve was normal.  The mitral valve was appeared normal and there mild mitral regurgitation  present.  The left ventricle was normal in size.  There was severe hypokinesis at the  inferior wall with estimated an ejection fraction of around 35-40%.  Coronary arteries arise and distribute normally.  There is calcification  noted in the left and the right coronary artery system.  Left main coronary artery is normal.  Left anterior descending has a severe 90% stenosis prior to septal  perforator and is then occluded.  It fills by means of patent internal  mammary graft.  Circumflex coronary artery: Severely diseased and occluded proximally.  Right coronary artery is occluded proximally.  Saphenous vein graft to the circumflex  marginal and distal circumflex is  patent.  There is an eccentric 40% proximal stenosis and moderate  irregularity in the body of the graft.  Distally, there is an eccentric 70%  somewhat ulcerated stenosis prior to the insertion site.  There did not  appear to be enough room for distal protection.  Saphenous vein graft sequentially to the acute marginal, posterior  descending, and posterolateral branch: There is a severe eccentric 90%  ostial stenosis followed by an area of segmental narrowing of 80-90% in the  proximal portion of the graft.  The graft is a large vessel of approximately  5 mm.  The internal mammary graft to the LAD and diagonal is widely patent.  The  diagonal and LAD after the insertion sites are small and diffusely diseased.   IMPRESSION:  1. Severe saphenous vein graft involving the ostium of the graft to the     right coronary artery and moderately severe disease  in the distal portion     of the vein graft to the circumflex marginal branch with occlusion of the     distal end to the circumflex.  2. Patent internal mammary grafts to the left anterior descending and     diagonal with some diffuse distal disease.  3. Severe native three vessel coronary artery disease.  4. Severe hypokinesis of the inferior wall with estimated ejection fraction     of 35-40%.   RECOMMENDATIONS:  Very complex stenosis of diffuse disease.  Consideration  of stenting at the ostium of the vein graft to the right coronary artery  versus redo bypass grafting.  He has renal insufficiency and will be allowed  to recover from the contrast used for the catheterization.                                               [name]., M.D.    WST/MEDQ  D:  07/03/2003  T:  07/03/2003  Job:  [id number]   cc:   [name], M.D.  [address]  [place]  [place] [address]  Fax: [phone]

## 2011-04-15 NOTE — Discharge Summary (Signed)
[name], [name]                           ACCOUNT NO.:  [id number]   MEDICAL RECORD NO.:  [id number]                   PATIENT TYPE:  OIB   LOCATION:  2901                                 FACILITY:  [facility]   PHYSICIAN:  Darden Palmer., M.D.         DATE OF BIRTH:  02/14/1927   DATE OF ADMISSION:  07/10/2003  DATE OF DISCHARGE:  07/11/2003                                 DISCHARGE SUMMARY   FINAL DIAGNOSES:  1. Coronary artery disease involving the bypass graft to the right coronary     artery.     A. Successful stenting of the ostium of this graft.     B. Residual disease involving graft to the circumflex, which is        nonsuitable for intervention, will be treated medically.     C. Patent internal mammary graft sequentially to diagonal and left        anterior descending artery.  2. Type 2 diabetes.  3. Hypertension.  4. Peripheral vascular disease.  5. Renal insufficiency.   PROCEDURES:  Stenting of vein graft to right coronary artery.   HISTORY:  The patient is a 77 year old male who has a previous abnormal  Cardiolite and had demonstrated disease involving the ostium of the vein  graft to the right coronary artery.  Because his mammary graft was patent,  it was opted to do intervention on the vein graft to the right coronary  artery and treat the circumflex graft medically.  He was admitted at this  time for percutaneous treatment of this.  Please see the previous typed  history and physical for many of the details.   HOSPITAL COURSE:  Patient was brought in same day and had hydration  initiated and was given Mucomyst because of his renal insufficiency.  He was  taken to the catheterization laboratory and had stenting of the vein graft  to the right coronary artery with a 5.0 x 18.0 mm Ultra bare metal stent.  He tolerated the procedure well.  The procedure was done with distal  protection using a filter wire.  The procedure is described in detail on the  catheterization report.  He had some mild nausea that afternoon after the  procedure.  His sheath was removed without complications.  He was hydrated  overnight.  His renal function remains stable with a BUN of 36 and  creatinine of 1.8 the next day.  He had good urine output and was discharged  in improved condition.   DISCHARGE MEDICATIONS:  1. Aspirin 81 mg q.d.  2. Plavix 75 mg q.d.  3. ColBenemid 500 mg q.d.  4. Lipitor 10 mg q.d.  5. Diovan 320 mg q.d.  6. Micronase 10 mg q.d.  7. Nexium 40 mg q.d.  8. Norvasc 10 mg q.d.  9. Toprol XL 25 mg q.d.   He is to exercise regularly, is to walk and it is to  be considered for the  cardiac rehab program.  He is to call if there are further problems.  He  will be seen in the office on Tuesday for a repeat renal panel and is  encouraged fluid intake over the weekend.                                                  Darden Palmer., M.D.    WST/MEDQ  D:  07/11/2003  T:  07/11/2003  Job:  [id number]   cc:   Geoffry Paradise, M.D.  [address]  [place]  [place] [address]  Fax: [phone]

## 2011-10-06 IMAGING — CR DG CHEST 2V
2 series · 2 of 2 positions shown · non-contrast
Comparison: 01/25/2007.

CLINICAL DATA: Crackles at the right lung base.

CHEST - 2 VIEW

[w chest pa]
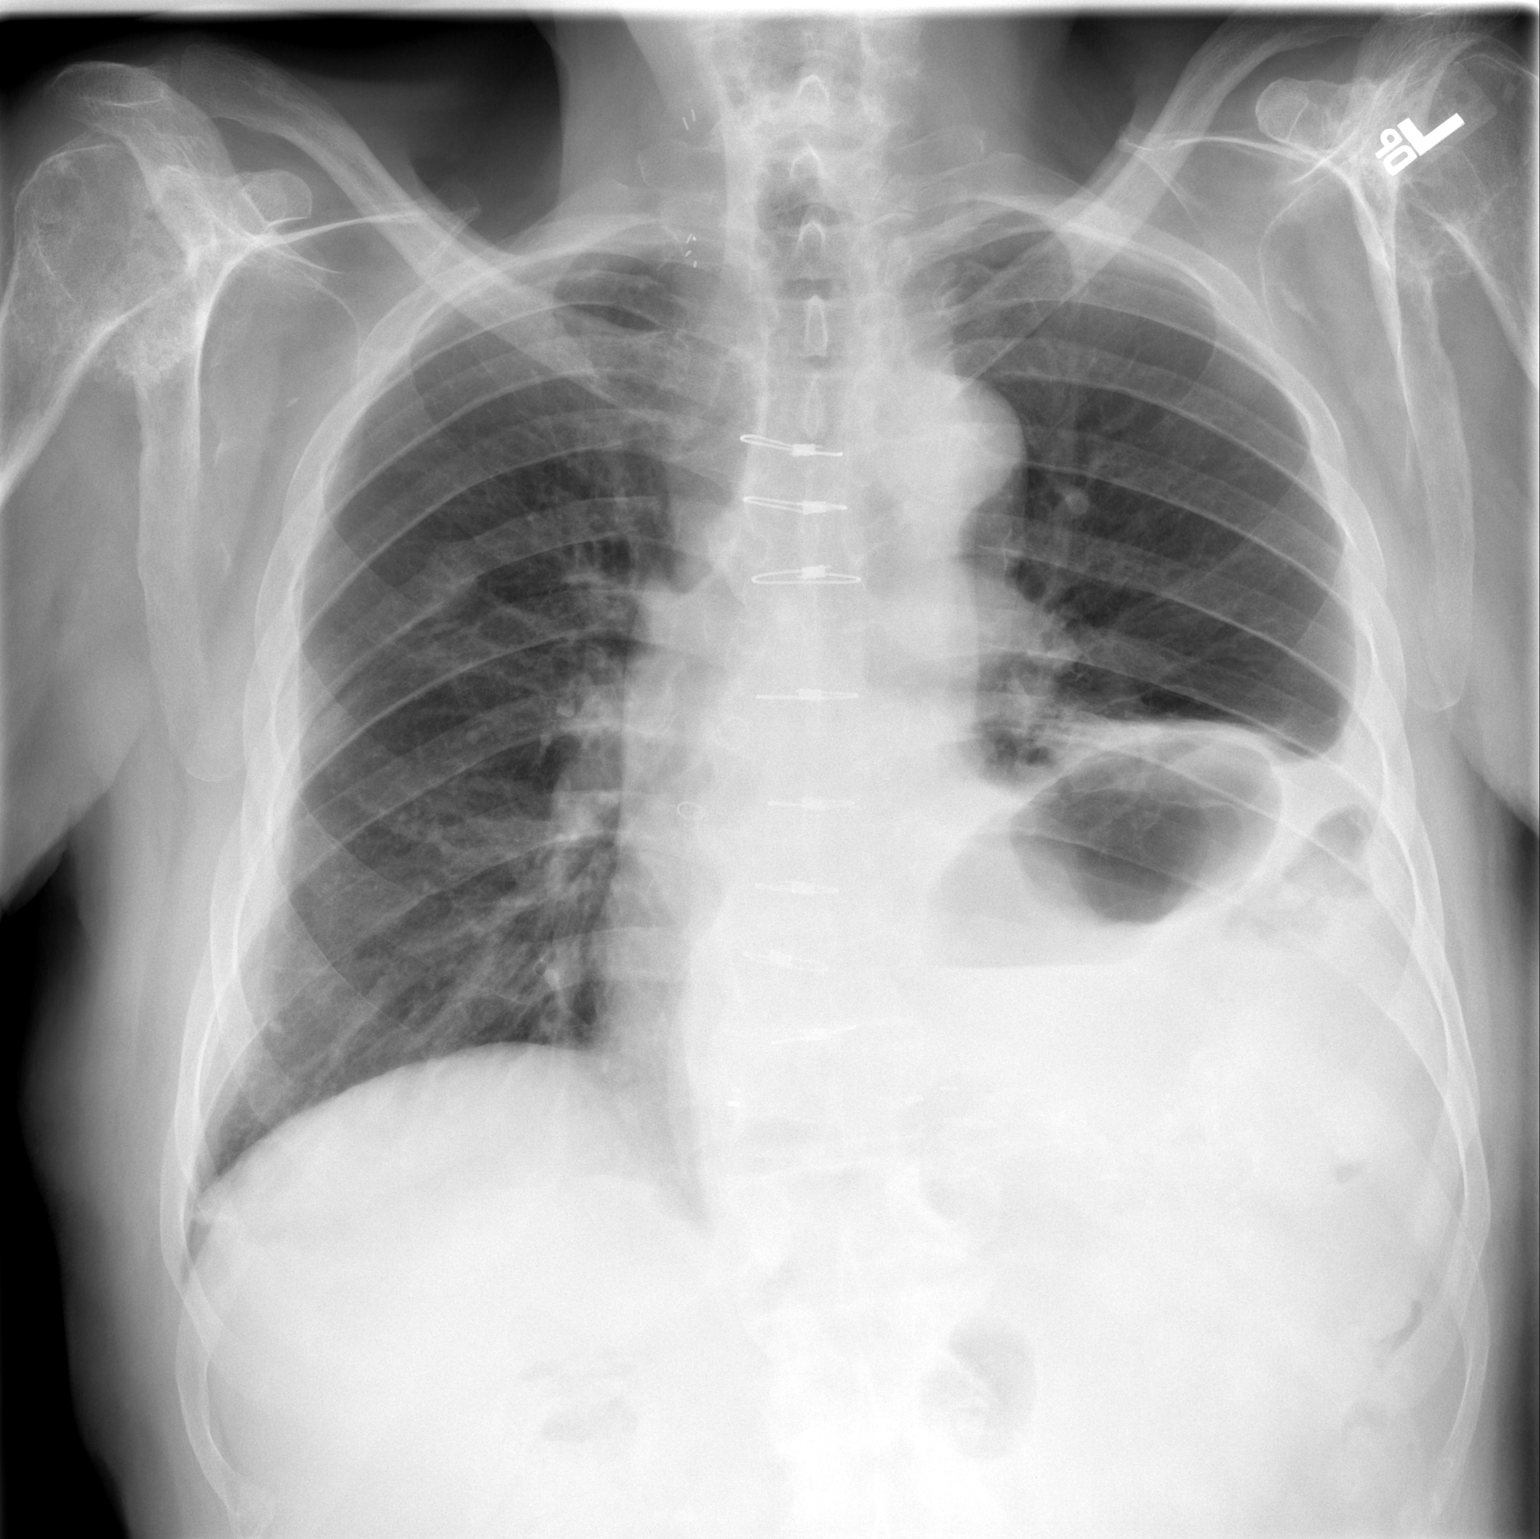

[w chest lat]
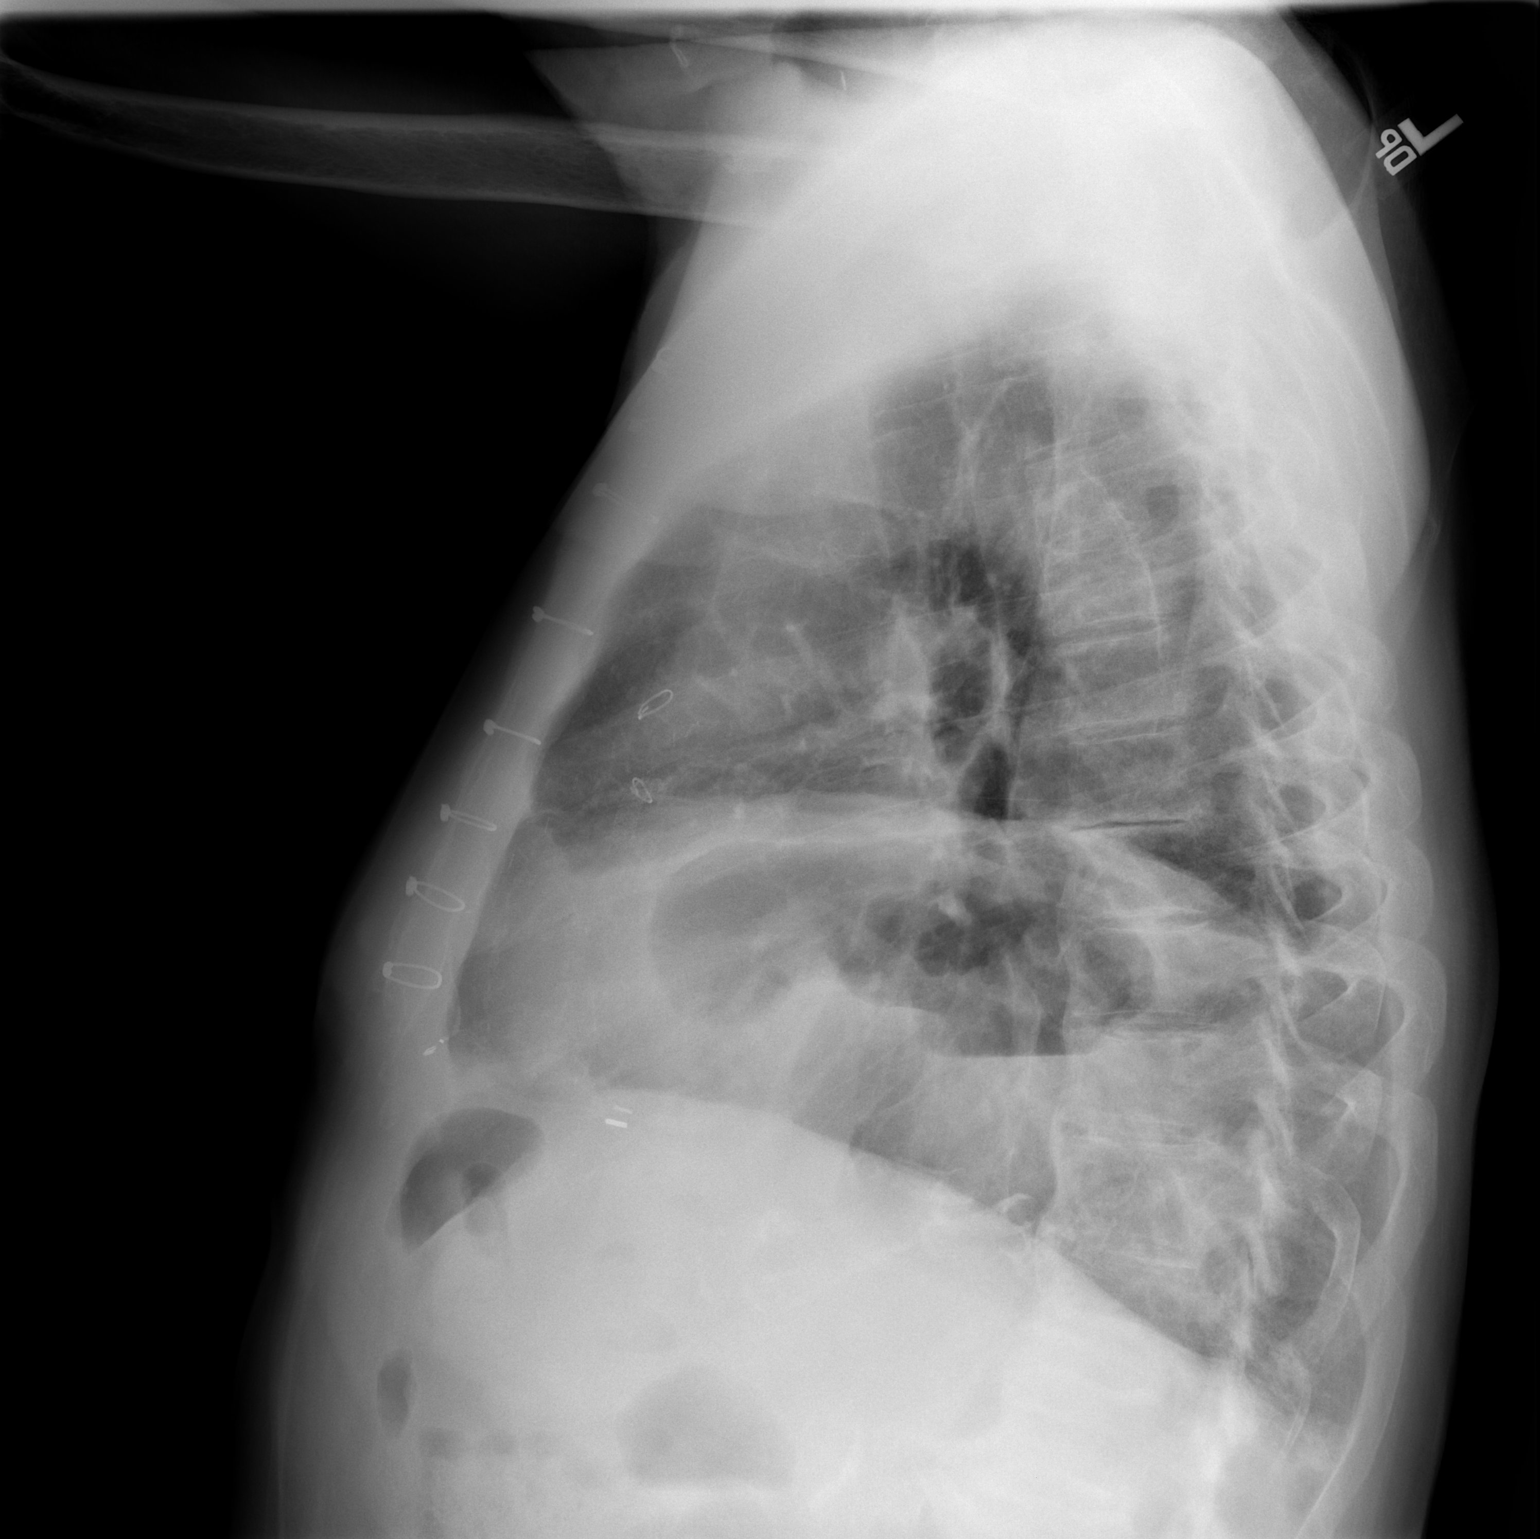

[2 of 2 positions shown; findings below may reference images not displayed]

FINDINGS: Asymmetric elevation of the right hemidiaphragm is
stable.  Lungs are clear without focal consolidation, edema, or
effusion.  Mild pleural thickening on the right is unchanged.  The
patient is status post CABG.  Nonacute posterior right rib fracture
is new in the interval.  There is advanced degenerative changes in
the shoulders.
IMPRESSION: Stable cardiopulmonary exam.  No evidence for acute cardiopulmonary
process.

## 2012-04-17 ENCOUNTER — Ambulatory Visit (INDEPENDENT_AMBULATORY_CARE_PROVIDER_SITE_OTHER): Payer: Medicare Other | Admitting: Internal Medicine

## 2012-04-17 ENCOUNTER — Encounter: Payer: Self-pay | Admitting: Internal Medicine

## 2012-04-17 VITALS — BP 100/60 | HR 80 | Ht 72.0 in | Wt 162.2 lb

## 2012-04-17 DIAGNOSIS — R159 Full incontinence of feces: Secondary | ICD-10-CM

## 2012-04-17 DIAGNOSIS — R198 Other specified symptoms and signs involving the digestive system and abdomen: Secondary | ICD-10-CM

## 2012-04-17 MED ORDER — METRONIDAZOLE 500 MG PO TABS: 500.0000 mg | ORAL_TABLET | Freq: Two times a day (BID) | ORAL | Status: AC

## 2012-04-17 MED ORDER — CHOLESTYRAMINE 4 G PO PACK: PACK | ORAL | Status: DC

## 2012-04-17 NOTE — Progress Notes (Signed)
HISTORY OF PRESENT ILLNESS:  [name] is a 78 y.o. male with multiple medical problems including coronary artery disease status post coronary artery bypass grafting, carotid artery disease status post carotid endarterectomy, renal vascular disease with renal stents, diabetes mellitus, osteoarthritis, hyperlipidemia, and adenomatous colon polyps. He presents today for evaluation of fecal incontinence and change in bowel consistency. He was seen for this same problem in January 2011, but now it is worse. See that dictation. He is again accompanied by his wife, who provides significant historical input. At the time of his last visit, his problems seem to improve after a course of metronidazole. He and his wife report that he seems to be incontinent of pasty stools about 3-4 times per month. The patient the nature of his stools as a new problem. No steatorrhea.. He does not move his bowels daily. The patient states that he has no warning with these bowel movements. There is no associated abdominal pain or bleeding. Weight has been stable. His wife is responsible for cleaning the accident. They tell me that he has had about 3 courses of metronidazole over the years. They seem to think that this helps. She has a difficult time quantifying his problem, but thinks that it is worse. His activity level and mobility is quite limited. He recently contacted Dr. Jacky Kindle with recurrent problems. He recommended GI evaluation. Patient had prior upper endoscopy and colonoscopy in May 2006 without significant pathology found. He occasionally has nocturnal symptoms. Outside records from Dr. Jacky Kindle have been requested and reviewed including the January office visit. They tell me that his chronic medical problems, in particular his diabetes, peripheral vascular disease, and hypertension have been stable. He is on multiple medications including Plavix. He is on insulin for his diabetes.  REVIEW OF SYSTEMS:  All non-GI ROS  negative except for hearing problems, arthritis, fatigue, somnolence  Past Medical History  Diagnosis Date  . Colon polyps     hyperplastic  . Renal insufficiency   . Osteoarthritis   . Hyperlipidemia   . Diabetes mellitus   . CAD (coronary artery disease)   . Anemia   . Glaucoma   . Macular degeneration   . Gout     Past Surgical History  Procedure Date  . Bilateral renal artery stents   . Carotid endarterectomy   . Coronary artery bypass graft   . Cardiac catheterization and stent     left  . Cataract extraction     left    Social History [name]  reports that he quit smoking about 18 years ago. He has never used smokeless tobacco. He reports that he does not drink alcohol or use illicit drugs.  family history includes Arthritis in his mother; Coronary artery disease in his brother; Diabetes in his maternal grandmother, maternal uncle, and paternal grandmother; Goiter in his mother; and Heart attack in his father.  No Known Allergies     PHYSICAL EXAMINATION: Vital signs: BP 100/60  Pulse 80  Ht 6' (1.829 m)  Wt 162 lb 3.2 oz (73.573 kg)  BMI 22.00 kg/m2  Constitutional: elderly male, frail, no acute distress. In the wheelchair Psychiatric: alert and oriented x3, cooperative Eyes: extraocular movements intact, anicteric, conjunctiva pink Mouth: oral pharynx moist, no lesions Neck: supple no lymphadenopathy Cardiovascular: heart regular rate and rhythm, no murmur Lungs: clear to auscultation bilaterally Abdomen: soft, nontender, nondistended, no obvious ascites, no peritoneal signs, normal bowel sounds, no organomegaly Extremities: no lower extremity edema bilaterally Skin: multiple  ecchymoses Neuro: No focal deficits. No asterixis.     ASSESSMENT:  #1. Fecal incontinence. Worsening. Likely neurogenic. #2. Change in stool consistency. New problem. Questionable response to metronidazole. Question bacterial overgrowth. #3. Multiple medical problems  including diabetes mellitus. Stable #4. History of colon polyps. Last colonoscopy 2006    PLAN:  #1. Questran 4 g once daily to see if stool consistency improves. Instructed not to take within 2 hours of other medications #2. Wear protective undergarments daily #3. Prescribe metronidazole 500 mg by mouth twice a day x10 days. If they are clear that this is helpful, then do not use a Foster than once every 6 weeks. 3 refills provided. #4. Followup as needed

## 2012-04-17 NOTE — Patient Instructions (Signed)
We have sent the following medications to your pharmacy for you to pick up at your convenience:  Metronidazole and Questran  You may also purchase some depends to help with accidents

## 2012-05-23 ENCOUNTER — Telehealth: Payer: Self-pay | Admitting: Internal Medicine

## 2012-05-23 NOTE — Telephone Encounter (Signed)
Pts wife states the pt was given Questran to improve the problem with loose stools, she reports this has helped. Pt has not had a bowel movement now in 3 days. Wife wants to know if they should increase fiber or start a stool softener, she has metamucil on hand. Dr. Marina Goodell please advise.

## 2012-05-23 NOTE — Telephone Encounter (Signed)
Spoke with pts wife and she is aware. 

## 2012-05-23 NOTE — Telephone Encounter (Signed)
Stop Questran and start a stool softener. Do not resume Questran unless problems with loose stools and incontinence returned significantly. They may need to titrate the medication for optimal results

## 2012-07-09 ENCOUNTER — Other Ambulatory Visit: Payer: Self-pay | Admitting: Cardiology

## 2012-07-16 ENCOUNTER — Other Ambulatory Visit: Payer: Self-pay | Admitting: Cardiology

## 2012-09-12 ENCOUNTER — Encounter: Payer: Self-pay | Admitting: Vascular Surgery

## 2012-09-18 ENCOUNTER — Encounter: Payer: Self-pay | Admitting: Neurosurgery

## 2012-09-19 ENCOUNTER — Ambulatory Visit: Payer: Medicare Other | Admitting: Neurosurgery

## 2012-11-19 ENCOUNTER — Telehealth: Payer: Self-pay | Admitting: Internal Medicine

## 2012-11-19 NOTE — Telephone Encounter (Signed)
Pt was seen back in May for bowel incontinence. Pt is still having problems per his wife. Pt has taken flagyl and questran but she states this has not helped. He takes the [place] daily. Wife states he is wearing depends but wants to know what else they can do. Please advise.

## 2012-11-19 NOTE — Telephone Encounter (Signed)
Unfortunately, there is not a whole lot more to offer. In addition to protective undergarments, at all times, they might consider scheduled trips to the bathroom several times per day in the same time of day-each day. He may be able to defecate at these times, and reduce the number of accidents. There are no medical therapy for this problem, the other items that we tried or empiric, and obviously did not help.Marland Kitchen

## 2012-11-19 NOTE — Telephone Encounter (Signed)
Spoke with pts wife and she is aware, she will try this and call us back if she needs anything further.

## 2013-05-13 ENCOUNTER — Inpatient Hospital Stay (HOSPITAL_COMMUNITY)
Admission: EM | Admit: 2013-05-13 | Discharge: 2013-05-17 | DRG: 121 | Disposition: A | Discharge status: Hospice | Payer: BC Managed Care – PPO | Attending: Cardiology | Admitting: Cardiology

## 2013-05-13 ENCOUNTER — Encounter (HOSPITAL_COMMUNITY): Payer: Self-pay | Admitting: Emergency Medicine

## 2013-05-13 ENCOUNTER — Emergency Department (HOSPITAL_COMMUNITY): Payer: BC Managed Care – PPO

## 2013-05-13 DIAGNOSIS — I5021 Acute systolic (congestive) heart failure: Secondary | ICD-10-CM | POA: Diagnosis present

## 2013-05-13 DIAGNOSIS — I70209 Unspecified atherosclerosis of native arteries of extremities, unspecified extremity: Secondary | ICD-10-CM | POA: Diagnosis present

## 2013-05-13 DIAGNOSIS — N184 Chronic kidney disease, stage 4 (severe): Secondary | ICD-10-CM | POA: Diagnosis present

## 2013-05-13 DIAGNOSIS — I5022 Chronic systolic (congestive) heart failure: Secondary | ICD-10-CM | POA: Diagnosis present

## 2013-05-13 DIAGNOSIS — E1129 Type 2 diabetes mellitus with other diabetic kidney complication: Secondary | ICD-10-CM | POA: Diagnosis present

## 2013-05-13 DIAGNOSIS — I119 Hypertensive heart disease without heart failure: Secondary | ICD-10-CM

## 2013-05-13 DIAGNOSIS — Z515 Encounter for palliative care: Secondary | ICD-10-CM

## 2013-05-13 DIAGNOSIS — I5023 Acute on chronic systolic (congestive) heart failure: Secondary | ICD-10-CM | POA: Diagnosis present

## 2013-05-13 DIAGNOSIS — R0600 Dyspnea, unspecified: Secondary | ICD-10-CM

## 2013-05-13 DIAGNOSIS — E118 Type 2 diabetes mellitus with unspecified complications: Secondary | ICD-10-CM | POA: Diagnosis present

## 2013-05-13 DIAGNOSIS — IMO0001 Reserved for inherently not codable concepts without codable children: Principal | ICD-10-CM | POA: Diagnosis present

## 2013-05-13 DIAGNOSIS — I2510 Atherosclerotic heart disease of native coronary artery without angina pectoris: Secondary | ICD-10-CM | POA: Diagnosis present

## 2013-05-13 DIAGNOSIS — N185 Chronic kidney disease, stage 5: Secondary | ICD-10-CM | POA: Diagnosis present

## 2013-05-13 DIAGNOSIS — R634 Abnormal weight loss: Secondary | ICD-10-CM | POA: Diagnosis present

## 2013-05-13 DIAGNOSIS — R32 Unspecified urinary incontinence: Secondary | ICD-10-CM | POA: Diagnosis present

## 2013-05-13 DIAGNOSIS — E1142 Type 2 diabetes mellitus with diabetic polyneuropathy: Secondary | ICD-10-CM | POA: Diagnosis present

## 2013-05-13 DIAGNOSIS — I798 Other disorders of arteries, arterioles and capillaries in diseases classified elsewhere: Secondary | ICD-10-CM | POA: Diagnosis present

## 2013-05-13 DIAGNOSIS — R159 Full incontinence of feces: Secondary | ICD-10-CM | POA: Diagnosis present

## 2013-05-13 DIAGNOSIS — Z681 Body mass index (BMI) 19 or less, adult: Secondary | ICD-10-CM

## 2013-05-13 DIAGNOSIS — E1159 Type 2 diabetes mellitus with other circulatory complications: Secondary | ICD-10-CM | POA: Diagnosis present

## 2013-05-13 DIAGNOSIS — N058 Unspecified nephritic syndrome with other morphologic changes: Secondary | ICD-10-CM | POA: Diagnosis present

## 2013-05-13 DIAGNOSIS — Z794 Long term (current) use of insulin: Secondary | ICD-10-CM

## 2013-05-13 DIAGNOSIS — Z7982 Long term (current) use of aspirin: Secondary | ICD-10-CM

## 2013-05-13 DIAGNOSIS — I214 Non-ST elevation (NSTEMI) myocardial infarction: Secondary | ICD-10-CM | POA: Diagnosis present

## 2013-05-13 DIAGNOSIS — I509 Heart failure, unspecified: Secondary | ICD-10-CM | POA: Diagnosis present

## 2013-05-13 DIAGNOSIS — Z66 Do not resuscitate: Secondary | ICD-10-CM | POA: Diagnosis present

## 2013-05-13 DIAGNOSIS — Z79899 Other long term (current) drug therapy: Secondary | ICD-10-CM

## 2013-05-13 DIAGNOSIS — L98499 Non-pressure chronic ulcer of skin of other sites with unspecified severity: Secondary | ICD-10-CM | POA: Diagnosis present

## 2013-05-13 DIAGNOSIS — E1149 Type 2 diabetes mellitus with other diabetic neurological complication: Secondary | ICD-10-CM | POA: Diagnosis present

## 2013-05-13 HISTORY — DX: Non-pressure chronic ulcer of skin of other sites with unspecified severity: L98.499

## 2013-05-13 HISTORY — DX: Type 2 diabetes mellitus without complications: E11.9

## 2013-05-13 HISTORY — DX: Shortness of breath: R06.02

## 2013-05-13 HISTORY — DX: Acute myocardial infarction, unspecified: I21.9

## 2013-05-13 HISTORY — DX: Type 2 diabetes mellitus with unspecified complications: E11.8

## 2013-05-13 HISTORY — DX: Hypertensive heart disease without heart failure: I11.9

## 2013-05-13 HISTORY — DX: Unspecified atherosclerosis of native arteries of extremities, unspecified extremity: I70.209

## 2013-05-13 HISTORY — DX: Chronic kidney disease, stage 4 (severe): N18.4

## 2013-05-13 HISTORY — DX: Heart failure, unspecified: I50.9

## 2013-05-13 LAB — CBC WITH DIFFERENTIAL/PLATELET
Basophils Absolute: 0.1 10*3/uL (ref 0.0–0.1)
Basophils Relative: 1 % (ref 0–1)
Eosinophils Absolute: 0.4 10*3/uL (ref 0.0–0.7)
Eosinophils Relative: 6 % — ABNORMAL HIGH (ref 0–5)
HCT: 34 % — ABNORMAL LOW (ref 39.0–52.0)
Hemoglobin: 11.1 g/dL — ABNORMAL LOW (ref 13.0–17.0)
Lymphocytes Relative: 17 % (ref 12–46)
Lymphs Abs: 1.2 10*3/uL (ref 0.7–4.0)
MCH: 31.7 pg (ref 26.0–34.0)
MCHC: 32.6 g/dL (ref 30.0–36.0)
MCV: 97.1 fL (ref 78.0–100.0)
Monocytes Absolute: 0.7 10*3/uL (ref 0.1–1.0)
Monocytes Relative: 11 % (ref 3–12)
Neutro Abs: 4.6 10*3/uL (ref 1.7–7.7)
Neutrophils Relative %: 66 % (ref 43–77)
Platelets: 228 10*3/uL (ref 150–400)
RBC: 3.5 MIL/uL — ABNORMAL LOW (ref 4.22–5.81)
RDW: 16.1 % — ABNORMAL HIGH (ref 11.5–15.5)
WBC: 7 10*3/uL (ref 4.0–10.5)

## 2013-05-13 LAB — COMPREHENSIVE METABOLIC PANEL
ALT: 7 U/L (ref 0–53)
AST: 29 U/L (ref 0–37)
Albumin: 3.1 g/dL — ABNORMAL LOW (ref 3.5–5.2)
Alkaline Phosphatase: 54 U/L (ref 39–117)
Alkaline Phosphatase: 54 U/L (ref 39–117)
BUN: 83 mg/dL — ABNORMAL HIGH (ref 6–23)
BUN: 84 mg/dL — ABNORMAL HIGH (ref 6–23)
CO2: 27 mEq/L (ref 19–32)
CO2: 29 mEq/L (ref 19–32)
Calcium: 8.9 mg/dL (ref 8.4–10.5)
Chloride: 98 mEq/L (ref 96–112)
Chloride: 98 mEq/L (ref 96–112)
Creatinine, Ser: 3.46 mg/dL — ABNORMAL HIGH (ref 0.50–1.35)
GFR calc Af Amer: 17 mL/min — ABNORMAL LOW (ref >90)
GFR calc Af Amer: 17 mL/min — ABNORMAL LOW (ref >90)
GFR calc non Af Amer: 15 mL/min — ABNORMAL LOW (ref >90)
Glucose, Bld: 126 mg/dL — ABNORMAL HIGH (ref 70–99)
Glucose, Bld: 133 mg/dL — ABNORMAL HIGH (ref 70–99)
Potassium: 4.2 mEq/L (ref 3.5–5.1)
Potassium: 4.3 mEq/L (ref 3.5–5.1)
Sodium: 139 mEq/L (ref 135–145)
Total Bilirubin: 0.4 mg/dL (ref 0.3–1.2)
Total Bilirubin: 0.4 mg/dL (ref 0.3–1.2)
Total Protein: 6.6 g/dL (ref 6.0–8.3)

## 2013-05-13 LAB — POCT I-STAT TROPONIN I: Troponin i, poc: 5.18 ng/mL (ref 0.00–0.08)

## 2013-05-13 LAB — GLUCOSE, CAPILLARY
Glucose-Capillary: 109 mg/dL — ABNORMAL HIGH (ref 70–99)
Glucose-Capillary: 118 mg/dL — ABNORMAL HIGH (ref 70–99)
Glucose-Capillary: 210 mg/dL — ABNORMAL HIGH (ref 70–99)

## 2013-05-13 LAB — TROPONIN I: Troponin I: 3.34 ng/mL (ref <0.30)

## 2013-05-13 LAB — PRO B NATRIURETIC PEPTIDE: Pro B Natriuretic peptide (BNP): 47817 pg/mL — ABNORMAL HIGH (ref 0–450)

## 2013-05-13 LAB — HEPARIN LEVEL (UNFRACTIONATED): Heparin Unfractionated: 0.58 IU/mL (ref 0.30–0.70)

## 2013-05-13 LAB — APTT: aPTT: 184 seconds — ABNORMAL HIGH (ref 24–37)

## 2013-05-13 MED ORDER — TIMOLOL HEMIHYDRATE 0.5 % OP SOLN: 1.0000 [drp] | Freq: Two times a day (BID) | OPHTHALMIC | Status: DC

## 2013-05-13 MED ORDER — ASPIRIN EC 81 MG PO TBEC
81.0000 mg | DELAYED_RELEASE_TABLET | Freq: Every day | ORAL | Status: DC
  Administered 2013-05-14 – 2013-05-17 (×4): 81 mg via ORAL
  Filled 2013-05-13 (×5): qty 1

## 2013-05-13 MED ORDER — CLOPIDOGREL BISULFATE 75 MG PO TABS
75.0000 mg | ORAL_TABLET | Freq: Every day | ORAL | Status: DC
  Administered 2013-05-14 – 2013-05-17 (×4): 75 mg via ORAL
  Filled 2013-05-13 (×4): qty 1

## 2013-05-13 MED ORDER — CALCITRIOL 0.25 MCG PO CAPS
0.2500 ug | ORAL_CAPSULE | Freq: Every day | ORAL | Status: DC
  Administered 2013-05-14 – 2013-05-17 (×4): 0.25 ug via ORAL
  Filled 2013-05-13 (×5): qty 1

## 2013-05-13 MED ORDER — TIMOLOL MALEATE 0.5 % OP SOLN
1.0000 [drp] | Freq: Two times a day (BID) | OPHTHALMIC | Status: DC
  Administered 2013-05-13 – 2013-05-17 (×8): 1 [drp] via OPHTHALMIC
  Filled 2013-05-13: qty 5

## 2013-05-13 MED ORDER — SODIUM CHLORIDE 0.9 % IJ SOLN
3.0000 mL | Freq: Two times a day (BID) | INTRAMUSCULAR | Status: DC
  Administered 2013-05-13 – 2013-05-16 (×7): 3 mL via INTRAVENOUS

## 2013-05-13 MED ORDER — ALLOPURINOL 100 MG PO TABS
100.0000 mg | ORAL_TABLET | Freq: Every day | ORAL | Status: DC
  Administered 2013-05-14 – 2013-05-17 (×4): 100 mg via ORAL
  Filled 2013-05-13 (×5): qty 1

## 2013-05-13 MED ORDER — ASPIRIN 81 MG PO CHEW
324.0000 mg | CHEWABLE_TABLET | Freq: Once | ORAL | Status: AC
  Administered 2013-05-13: 324 mg via ORAL
  Filled 2013-05-13: qty 4

## 2013-05-13 MED ORDER — HEPARIN (PORCINE) IN NACL 100-0.45 UNIT/ML-% IJ SOLN
950.0000 [IU]/h | INTRAMUSCULAR | Status: DC
  Administered 2013-05-13: 1000 [IU]/h via INTRAVENOUS
  Administered 2013-05-14: 950 [IU]/h via INTRAVENOUS
  Filled 2013-05-13 (×4): qty 250

## 2013-05-13 MED ORDER — FUROSEMIDE 10 MG/ML IJ SOLN
80.0000 mg | Freq: Three times a day (TID) | INTRAMUSCULAR | Status: DC
  Administered 2013-05-13 – 2013-05-16 (×9): 80 mg via INTRAVENOUS
  Filled 2013-05-13 (×11): qty 8

## 2013-05-13 MED ORDER — SODIUM CHLORIDE 0.9 % IJ SOLN
3.0000 mL | INTRAMUSCULAR | Status: DC | PRN
  Administered 2013-05-13 – 2013-05-14 (×3): 3 mL via INTRAVENOUS

## 2013-05-13 MED ORDER — INSULIN ASPART 100 UNIT/ML ~~LOC~~ SOLN
0.0000 [IU] | Freq: Three times a day (TID) | SUBCUTANEOUS | Status: DC
  Administered 2013-05-14: 3 [IU] via SUBCUTANEOUS
  Administered 2013-05-14: 0 [IU] via SUBCUTANEOUS
  Administered 2013-05-15: 3 [IU] via SUBCUTANEOUS
  Administered 2013-05-16: 2 [IU] via SUBCUTANEOUS
  Administered 2013-05-16 (×2): 3 [IU] via SUBCUTANEOUS

## 2013-05-13 MED ORDER — HEPARIN BOLUS VIA INFUSION
3500.0000 [IU] | Freq: Once | INTRAVENOUS | Status: AC
  Administered 2013-05-13: 3500 [IU] via INTRAVENOUS

## 2013-05-13 MED ORDER — SIMVASTATIN 10 MG PO TABS
10.0000 mg | ORAL_TABLET | Freq: Every day | ORAL | Status: DC
  Administered 2013-05-14 – 2013-05-16 (×3): 10 mg via ORAL
  Filled 2013-05-13 (×5): qty 1

## 2013-05-13 MED ORDER — SODIUM CHLORIDE 0.9 % IV SOLN
250.0000 mL | INTRAVENOUS | Status: DC | PRN
  Administered 2013-05-13: 250 mL via INTRAVENOUS

## 2013-05-13 MED ORDER — INSULIN ASPART PROT & ASPART (70-30 MIX) 100 UNIT/ML ~~LOC~~ SUSP
8.0000 [IU] | Freq: Two times a day (BID) | SUBCUTANEOUS | Status: DC
  Administered 2013-05-14 – 2013-05-16 (×6): 8 [IU] via SUBCUTANEOUS
  Filled 2013-05-13: qty 10

## 2013-05-13 MED ORDER — ONDANSETRON HCL 4 MG/2ML IJ SOLN: 4.0000 mg | Freq: Four times a day (QID) | INTRAMUSCULAR | Status: DC | PRN

## 2013-05-13 MED ORDER — ACETAMINOPHEN 325 MG PO TABS: 650.0000 mg | ORAL_TABLET | ORAL | Status: DC | PRN

## 2013-05-13 NOTE — ED Notes (Signed)
Dr Donnie Aho here to eval pt

## 2013-05-13 NOTE — Progress Notes (Signed)
Page MD Trop +,  Pt on Hep drip. MD states  already aware of trop they are just monitor. No need to do anything at this time. Will continue to monitor and inform Night no need to page MD for trop at this time.

## 2013-05-13 NOTE — ED Notes (Signed)
Pt c/o increased SOB for the past couple of weeks. Family sts pt is normally ambulatory with walker but recently is unable to walk more than 4 steps without difficulty. Pt respirations regular, SOB, shallow

## 2013-05-13 NOTE — Progress Notes (Signed)
ANTICOAGULATION CONSULT NOTE - Initial Consult  Pharmacy Consult for UFH Indication: r/o NSTEMI  No Known Allergies  Patient Measurements: Height: 6' 0.05" (183 cm) Weight: 162 lb 4.1 oz (73.6 kg) IBW/kg (Calculated) : 77.71 Heparin Dosing Weight: 73kg  Vital Signs: Temp: 97.7 F (36.5 C) (06/16 1237) Temp src: Oral (06/16 1237) BP: 116/63 mmHg (06/16 1345) Pulse Rate: 84 (06/16 1345)  Labs:  Recent Labs  05/13/13 1222 05/13/13 1334  HGB  --  11.1*  HCT  --  34.0*  PLT  --  228  CREATININE 3.46*  --     Estimated Creatinine Clearance: 16.8 ml/min (by C-G formula based on Cr of 3.46).   Medical History: Past Medical History  Diagnosis Date  . Colon polyps     hyperplastic  . Renal insufficiency   . Osteoarthritis   . Hyperlipidemia   . Diabetes mellitus   . CAD (coronary artery disease)   . Anemia   . Glaucoma   . Macular degeneration   . Gout     Medications:   (Not in a hospital admission)  Assessment: 79 y/o male patient admitted with shortness of breath and positive cardiac enzymes requiring heparin for anticoagulation. Baseline cbc wnl. EKG pending.  Goal of Therapy:  Heparin level 0.3-0.7 units/ml Monitor platelets by anticoagulation protocol: Yes   Plan:  Heparin 3500 unit IV bolus followed by infusion at 1000 units/hr. Check 8 hour heparin level with daily cbc and heparin level.  Verlene Mayer, PharmD, BCPS Pager [phone] 05/13/2013,2:49 PM

## 2013-05-13 NOTE — ED Notes (Signed)
Critical Troponin 3.34 given to cardiologist at bedside

## 2013-05-13 NOTE — Progress Notes (Signed)
Rec PT from ED. Pt 2l o2 Ox4, hard of hearing. Condom cath place. PT states no plan at this time/

## 2013-05-13 NOTE — Progress Notes (Signed)
ANTICOAGULATION CONSULT NOTE Pharmacy Consult for UFH Indication: r/o NSTEMI  No Known Allergies  Patient Measurements: Height: 6' (182.9 cm) Weight: 147 lb 4.3 oz (66.8 kg) IBW/kg (Calculated) : 77.6 Heparin Dosing Weight: 73kg  Vital Signs: Temp: 97.6 F (36.4 C) (06/16 2048) Temp src: Oral (06/16 2048) BP: 106/60 mmHg (06/16 2048) Pulse Rate: 83 (06/16 2048)  Labs:  Recent Labs  05/13/13 1222 05/13/13 1334 05/13/13 1425 05/13/13 1658 05/13/13 1703 05/13/13 2237  HGB  --  11.1*  --   --   --   --   HCT  --  34.0*  --   --   --   --   PLT  --  228  --   --   --   --   APTT  --   --   --  184*  --   --   LABPROT  --   --   --  17.1*  --   --   INR  --   --   --  1.43  --   --   HEPARINUNFRC  --   --   --   --   --  0.58  CREATININE 3.46*  --   --  3.59*  --   --   TROPONINI  --   --  3.34*  --  5.05* 4.69*    Estimated Creatinine Clearance: 14.7 ml/min (by C-G formula based on Cr of 3.59).  Assessment: 79 y/o male with CHF/possible ACS for heparin Goal of Therapy:  Heparin level 0.3-0.7 units/ml Monitor platelets by anticoagulation protocol: Yes   Plan:  Continue Heparin at current rate Follow-up am labs.  Geannie Risen, PharmD, BCPS  05/13/2013,11:36 PM

## 2013-05-13 NOTE — ED Provider Notes (Signed)
History    85yM with dyspnea. Gradual onset several weeks ago and progressively worsening. Initially only with exertion, but recently also when at rest. Currently denies CP, but has been having intermittently over same time period as well. Occasional cough, but this is more chronic in nature. No fever or chills. No unusual leg pain or swelling. Lasix recently increased from 20mg  to 40mg  daily because of his SOB. Symptoms persistent despite this. Known CAD. S/p CABG/stenting. Cardiologist Dr Donnie Aho. Reports recent evaluation, but was felling fairly well at that time.   CSN: [id number]  Arrival date & time 05/13/13  1158   First MD Initiated Contact with Patient 05/13/13 1233      Chief Complaint  Patient presents with  . Shortness of Breath    (Consider location/radiation/quality/duration/timing/severity/associated sxs/prior treatment) HPI  Past Medical History  Diagnosis Date  . Colon polyps     hyperplastic  . Renal insufficiency   . Osteoarthritis   . Hyperlipidemia   . Diabetes mellitus   . CAD (coronary artery disease)   . Anemia   . Glaucoma   . Macular degeneration   . Gout     Past Surgical History  Procedure Laterality Date  . Bilateral renal artery stents    . Carotid endarterectomy    . Coronary artery bypass graft    . Cardiac catheterization and stent      left  . Cataract extraction      left    Family History  Problem Relation Age of Onset  . Heart attack Father   . Arthritis Mother   . Goiter Mother   . Coronary artery disease Brother   . Diabetes Paternal Grandmother   . Diabetes Maternal Grandmother   . Diabetes Maternal Uncle     History  Substance Use Topics  . Smoking status: Former Smoker    Quit date: 12/17/1993  . Smokeless tobacco: Never Used  . Alcohol Use: No      Review of Systems  All systems reviewed and negative, other than as noted in HPI.   Allergies  Review of patient's allergies indicates no known  allergies.  Home Medications   Current Outpatient Rx  Name  Route  Sig  Dispense  Refill  . acetaminophen (TYLENOL) 500 MG tablet   Oral   Take 1,000 mg by mouth every 6 (six) hours as needed for pain.         Marland Kitchen allopurinol (ZYLOPRIM) 100 MG tablet   Oral   Take 100 mg by mouth daily.         Marland Kitchen aspirin EC 81 MG tablet   Oral   Take 81 mg by mouth daily.         . calcitRIOL (ROCALTROL) 0.25 MCG capsule   Oral   Take 0.25 mcg by mouth daily.         . clopidogrel (PLAVIX) 75 MG tablet   Oral   Take 75 mg by mouth daily.         . furosemide (LASIX) 20 MG tablet   Oral   Take 20-40 mg by mouth daily. Takes 40mg  for 1 week. Last dose due 05/17/13.  Further assessment regulates whether to continue on 20mg  or to stay on 40mg .         . insulin aspart protamine-insulin aspart (NOVOLOG 70/30) (70-30) 100 UNIT/ML injection   Subcutaneous   Inject 8 Units into the skin 2 (two) times daily with a meal.          .  pravastatin (PRAVACHOL) 40 MG tablet   Oral   Take 40 mg by mouth daily.         . timolol (BETIMOL) 0.5 % ophthalmic solution   Both Eyes   Place 1 drop into both eyes 2 (two) times daily.            BP 116/63  Pulse 84  Temp(Src) 97.7 F (36.5 C) (Oral)  Resp 23  SpO2 97%  Physical Exam  Nursing note and vitals reviewed. Constitutional: He appears well-developed and well-nourished. No distress.  HENT:  Head: Normocephalic and atraumatic.  Eyes: Conjunctivae are normal. Right eye exhibits no discharge. Left eye exhibits no discharge.  Neck: Neck supple.  Cardiovascular: Normal rate, regular rhythm and normal heart sounds.  Exam reveals no gallop and no friction rub.   No murmur heard. Pulmonary/Chest: Effort normal and breath sounds normal. He has no wheezes.  Mild tachypnea. Speaking in short phrases.   Abdominal: Soft. He exhibits no distension. There is no tenderness.  Musculoskeletal: He exhibits no edema and no tenderness.  Lower  extremities symmetric as compared to each other. No calf tenderness. Negative Homan's. No palpable cords.   Neurological: He is alert.  Skin: Skin is warm and dry.  Psychiatric: He has a normal mood and affect. His behavior is normal. Thought content normal.    ED Course  Procedures (including critical care time)  Labs Reviewed  PRO B NATRIURETIC PEPTIDE - Abnormal; Notable for the following:    Pro B Natriuretic peptide (BNP) 47817.0 (*)    All other components within normal limits  CBC WITH DIFFERENTIAL - Abnormal; Notable for the following:    RBC 3.50 (*)    Hemoglobin 11.1 (*)    HCT 34.0 (*)    RDW 16.1 (*)    Eosinophils Relative 6 (*)    All other components within normal limits  COMPREHENSIVE METABOLIC PANEL - Abnormal; Notable for the following:    Glucose, Bld 126 (*)    BUN 83 (*)    Creatinine, Ser 3.46 (*)    Albumin 3.1 (*)    GFR calc non Af Amer 15 (*)    GFR calc Af Amer 17 (*)    All other components within normal limits  GLUCOSE, CAPILLARY - Abnormal; Notable for the following:    Glucose-Capillary 109 (*)    All other components within normal limits  POCT I-STAT TROPONIN I - Abnormal; Notable for the following:    Troponin i, poc 5.18 (*)    All other components within normal limits  TROPONIN I   Dg Chest 2 View  05/13/2013   *RADIOLOGY REPORT*  Clinical Data: Shortness of breath and weakness.  CHEST - 2 VIEW  Comparison: 09/02/2009.  Findings: There is stable chronic elevation of the left hemidiaphragm status post median sternotomy and CABG.  The patient has developed small bilateral pleural effusions with associated bibasilar atelectasis.  There is no overt pulmonary edema or confluent airspace opacity.  There is no pneumothorax.  Advanced glenohumeral degenerative changes are present bilaterally.  IMPRESSION:  1.  New pleural effusions and bibasilar atelectasis.  No evidence of overt congestive heart failure. 2.  Advanced glenohumeral degenerative changes  bilaterally.   Original Report Authenticated By: Carey Bullocks, M.D.   EKG:  Rhythm: normal sinus Vent. rate 76 BPM PR interval 158 ms QRS duration 180 ms QT/QTc 476/535 ms LBBB ST segments: NS ST changes LBBB noted on previous EKGs   1. Dyspnea   2.  NSTEMI (non-ST elevated myocardial infarction)       MDM  85yf with dyspnea and intermittent CP. NSTEMI. LBBB on EKG, but noted on previous. ASA and heparin. Discussed with pt's cardiologist, Dr Donnie Aho. Admission.         Raeford Razor, MD 05/13/13 [phone]

## 2013-05-13 NOTE — H&P (Signed)
History and Physical   Admit date: 05/13/2013 Name:  [name] Medical record number: [id number] DOB/Age:  1927/05/11  79 y.o. male  Referring Physician:  [facility] -Dr. Juleen China  Primary Physician: Dr. Geoffry Paradise  Chief complaint/reason for admission:  Shortness of breath, chest pain  HPI:  This 79 year old male has a history of coronary artery disease with bypass grafting in 1993. He had aStent placed to the vein graft of the right coronary artery several years ago. He has developed stage IV chronic kidney disease and does not wish to have renal replacement therapy. He has diabetes mellitus with multiple complications and also has some depression. He has had recurrent ulcers on his feet as well as some infection.  He was just seen in May which point in time he was feeble but doing relatively well. Shortly after that he developed progressive dyspnea with exertion and saw his primary doctor who asked him to increase his furosemide from 20 mg to 40 mg daily. Despite this he has continued to have marked dyspnea with exertion and has some vague chest discomfort and was feeling quite poorly today and came to the emergency room. He had a markedly elevated BNP and mildly elevated troponin and I was asked to see him. He does not wish to have aggressive resuscitative measures. Of note he has lost about 20 pounds of weight recently. He has significant issues with depression. He has had significant difficulty sleeping with PND and orthopnea.   Past Medical History  Diagnosis Date  . Colon polyps     hyperplastic  . Osteoarthritis   . Hyperlipidemia   . CAD (coronary artery disease)   . Anemia   . Glaucoma   . Macular degeneration   . Gout   . Chronic kidney disease (CKD), stage IV (severe)   . Diabetes mellitus type 2 with complications 05/13/2013  . Hypertensive heart disease 05/13/2013  . Atherosclerotic peripheral vascular disease with ulceration   . Myocardial infarction    . Shortness of breath   . Diabetes mellitus without complication   . CHF (congestive heart failure)        Past Surgical History  Procedure Laterality Date  . Bilateral renal artery stents    . Carotid endarterectomy Right   . Coronary artery bypass graft  1993     LIMA to LAD-Dx, SVG to OM-circ, SVG to AM-PD-PL 12/27/91 [facility]  . Cataract extraction      left  . Hernia repair     Allergies: has No Known Allergies.   Medications: Prior to Admission medications   Medication Sig Start Date End Date Taking? Authorizing Provider  acetaminophen (TYLENOL) 500 MG tablet Take 1,000 mg by mouth every 6 (six) hours as needed for pain.   Yes Historical Provider, MD  allopurinol (ZYLOPRIM) 100 MG tablet Take 100 mg by mouth daily.   Yes Historical Provider, MD  aspirin EC 81 MG tablet Take 81 mg by mouth daily.   Yes Historical Provider, MD  calcitRIOL (ROCALTROL) 0.25 MCG capsule Take 0.25 mcg by mouth daily.   Yes Historical Provider, MD  clopidogrel (PLAVIX) 75 MG tablet Take 75 mg by mouth daily.   Yes Historical Provider, MD  furosemide (LASIX) 20 MG tablet Take 20-40 mg by mouth daily. Takes 40mg  for 1 week. Last dose due 05/17/13.  Further assessment regulates whether to continue on 20mg  or to stay on 40mg .   Yes Historical Provider, MD  insulin aspart protamine-insulin aspart (NOVOLOG 70/30) (  70-30) 100 UNIT/ML injection Inject 8 Units into the skin 2 (two) times daily with a meal.    Yes Historical Provider, MD  pravastatin (PRAVACHOL) 40 MG tablet Take 40 mg by mouth daily.   Yes Historical Provider, MD  timolol (BETIMOL) 0.5 % ophthalmic solution Place 1 drop into both eyes 2 (two) times daily.    Yes Historical Provider, MD    Family History:  Family Status  Relation Status Death Age  . Father Deceased 34    died of CVA and CAD  . Mother Deceased 23    died of unknown causes  . Brother Deceased 50    died of Alheimer's and DM  . Brother Deceased 44    died of comps of  diabetes  . Brother Deceased     Social History:   reports that he quit smoking about 19 years ago. He has never used smokeless tobacco. He reports that he does not drink alcohol or use illicit drugs.   History   Social History Narrative  . No narrative on file     Review of Systems:   He has lost about 20 pounds of weight recently.He has had severe issues with the complaint of incontinence and this is really quite issue for him. He has some mild urinary incontinence and significant frequency. He has difficulty with his vision due to previous diabetic retinopathy. He has been receiving physical therapy because of a severe lack of balance and has to walk with a walker.He is quite limited in what he is able to do. He has reduced appetite. Other than as noted above, the remainder of the review of systems is normal  Physical Exam: BP 110/59  Pulse 81  Temp(Src) 97.3 F (36.3 C) (Oral)  Resp 18  Ht 6' (1.829 m)  Wt 66.8 kg (147 lb 4.3 oz)  BMI 19.97 kg/m2  SpO2 98% General appearance: thin, pale white male who is in no respiratory distress blood appears ill Head: Normocephalic, without obvious abnormality, atraumatic, balding male hair pattern Eyes: conjunctivae/corneas clear. PERRL, EOM's intact. Fundi Not examined Neck: no adenopathy, no carotid bruit, no JVD, supple, symmetrical, trachea midline and healed carotid endarterectomy scar, JVP mildly elevated Lungs: reduced breath sounds at base Heart: normal S1 and S2, no S3, 2/6systolic murmur heard at apex Abdomen: soft, non-tender; bowel sounds normal; no masses,  no organomegaly Rectal: deferred Extremities: previous saphenous vein harvesting scars on right leg, 1+ edema on the right, no edema on the left, Pulses: bilateral femoral bruits noted, peripheral pulses are diminished. Skin: Skin color, texture, turgor normal. No rashes or lesions Neurologic: Not able to test gait, strength is normal, he is oriented to  place.   Labs: CBC  Recent Labs  05/13/13 1334  WBC 7.0  RBC 3.50*  HGB 11.1*  HCT 34.0*  PLT 228  MCV 97.1  MCH 31.7  MCHC 32.6  RDW 16.1*  LYMPHSABS 1.2  MONOABS 0.7  EOSABS 0.4  BASOSABS 0.1   CMP   Recent Labs  05/13/13 1658  NA 141  K 4.3  CL 98  CO2 29  GLUCOSE 133*  BUN 84*  CREATININE 3.59*  CALCIUM 8.9  PROT 6.5  ALBUMIN 3.0*  AST 28  ALT 7  ALKPHOS 54  BILITOT 0.4  GFRNONAA 14*  GFRAA 17*   BNP (last 3 results)  Recent Labs  05/13/13 1221  PROBNP 47817.0*   Cardiac Panel (last 3 results)  Recent Labs  05/13/13 1425 05/13/13 1703  TROPONINI 3.34* 5.05*   BNP    Component Value Date/Time   PROBNP 47817.0* 05/13/2013 1221    EKG: Sinus rhythm, left bundle branch block  Radiology: Pleural effusions and basilar atelectasis   IMPRESSIONS: 1. Acute congestive heart failure with markedly elevated BNP 2. Coronary artery disease with previous bypass grafting and previous stenting of the vein graft to right coronary artery 3. Stage IV chronic kidney disease 4. Hypertensive heart disease 5. Severe peripheral vascular disease with previous ulcerations 6. Diabetes mellitus with retinopathy, neuropathy and nephropathy 7. History of depression 8. Hyperlipidemia 9. Non-ST elevation myocardial infarction  PLAN: Admit to telemetry. The patient has advanced kidney Disease and does not wish to consider renal replacement therapy. He also wishes to BE a no CODE BLUE. I will plan for him to have intensive intravenous diuresis. Continue heparin overnight because of vague chest pain. Obtain echocardiogram.  Signed: Darden Palmer MD [facility]  05/13/2013, 7:18 PM  ;;

## 2013-05-13 NOTE — ED Notes (Signed)
Sob x 3-4 weeks may be longer called the dr and they changed his bp meds and now  Pt acnnot lay flat and coughs a lot

## 2013-05-13 NOTE — ED Notes (Signed)
Results of i-stat troponin told to primary nurse and PA-C

## 2013-05-14 LAB — GLUCOSE, CAPILLARY
Glucose-Capillary: 97 mg/dL (ref 70–99)
Glucose-Capillary: 117 mg/dL — ABNORMAL HIGH (ref 70–99)

## 2013-05-14 LAB — CBC
MCHC: 32.1 g/dL (ref 30.0–36.0)
MCV: 98.2 fL (ref 78.0–100.0)
Platelets: 222 10*3/uL (ref 150–400)
RDW: 16 % — ABNORMAL HIGH (ref 11.5–15.5)
WBC: 6.8 10*3/uL (ref 4.0–10.5)

## 2013-05-14 LAB — HEMOGLOBIN A1C
Hgb A1c MFr Bld: 5.6 % (ref <5.7)
Mean Plasma Glucose: 114 mg/dL (ref <117)

## 2013-05-14 LAB — HEPARIN LEVEL (UNFRACTIONATED): Heparin Unfractionated: 0.71 IU/mL — ABNORMAL HIGH (ref 0.30–0.70)

## 2013-05-14 LAB — BASIC METABOLIC PANEL
Chloride: 97 mEq/L (ref 96–112)
Creatinine, Ser: 3.54 mg/dL — ABNORMAL HIGH (ref 0.50–1.35)
GFR calc Af Amer: 17 mL/min — ABNORMAL LOW (ref >90)
GFR calc non Af Amer: 15 mL/min — ABNORMAL LOW (ref >90)
Potassium: 3.9 mEq/L (ref 3.5–5.1)

## 2013-05-14 MED ORDER — ENSURE COMPLETE PO LIQD
237.0000 mL | Freq: Two times a day (BID) | ORAL | Status: DC
  Administered 2013-05-14 – 2013-05-17 (×7): 237 mL via ORAL

## 2013-05-14 MED ORDER — ENOXAPARIN SODIUM 30 MG/0.3ML ~~LOC~~ SOLN
30.0000 mg | SUBCUTANEOUS | Status: DC
  Administered 2013-05-14 – 2013-05-16 (×3): 30 mg via SUBCUTANEOUS
  Filled 2013-05-14 (×4): qty 0.3

## 2013-05-14 NOTE — Progress Notes (Signed)
Patient got up to chair x1 today for about 1 hour and then wanted to get back in bed.  Patient's bottom was slightly pink upon assessment this am, but patient was found scratching bottom and now has abrasions to buttocks bilaterally.  Area was cleansed and thick barrier cream was applied to pt.

## 2013-05-14 NOTE — Progress Notes (Signed)
UR Chart Review Completed  

## 2013-05-14 NOTE — Progress Notes (Signed)
  Echocardiogram 2D Echocardiogram has been performed.  Cathie Beams 05/14/2013, 3:56 PM

## 2013-05-14 NOTE — Progress Notes (Signed)
INITIAL NUTRITION ASSESSMENT  DOCUMENTATION CODES Per approved criteria  -Not Applicable   INTERVENTION: 1. Ensure Complete po BID, each supplement provides 350 kcal and 13 grams of protein.   NUTRITION DIAGNOSIS: Inadequate oral intake related to decreased appetite and taste changes as evidenced by weight loss.   Goal: PO intake to meet >/=90% estimated nutrition needs  Monitor:  PO intake, weight trends, labs, I/O's  Reason for Assessment: Malnutrition Screening Tool  79 y.o. male  Admitting Dx: Acute congestive heart failure  ASSESSMENT: Pt admitted with increased SOB, failed out patient management with increased diuretics. Being admitted for IV diuretics. Noted that despite increased fluid status, pt weight has been trending down. Likely with additional weight loss masked by fluid status.   Weight hx shows 15 lb weight loss in 1 year, 9% weight loss. Pt reports poor appetite. Family in room states pt usually eats most of breakfast but is unable to eat >/=50% of lunch and dinner. Does 1 oral nutrition supplement shake per day. His reason for not eating well is "nothing tastes good".   Height: Ht Readings from Last 1 Encounters:  05/13/13 6' (1.829 m)    Weight: Wt Readings from Last 1 Encounters:  05/14/13 147 lb 0.8 oz (66.7 kg)    Ideal Body Weight: 178 lbs   % Ideal Body Weight: 82%  Wt Readings from Last 10 Encounters:  05/14/13 147 lb 0.8 oz (66.7 kg)  04/17/12 162 lb 3.2 oz (73.573 kg)  12/01/09 173 lb (78.472 kg)    Usual Body Weight: 162 lbs about 1 year ago   % Usual Body Weight: 90%  BMI:  Body mass index is 19.94 kg/(m^2). WNL   Estimated Nutritional Needs: Kcal: 2000-2200 Protein: 40-50 gm  Fluid: 2-2.2 L   Skin: intact   Diet Order: Cardiac  EDUCATION NEEDS: -No education needs identified at this time   Intake/Output Summary (Last 24 hours) at 05/14/13 1015 Last data filed at 05/14/13 0903  Gross per 24 hour  Intake    612 ml   Output    200 ml  Net    412 ml    Last BM: PTA    Labs:   Recent Labs Lab 05/13/13 1222 05/13/13 1658 05/14/13 0525  NA 139 141 139  K 4.2 4.3 3.9  CL 98 98 97  CO2 27 29 30   BUN 83* 84* 83*  CREATININE 3.46* 3.59* 3.54*  CALCIUM 8.9 8.9 8.9  GLUCOSE 126* 133* 131*    CBG (last 3)   Recent Labs  05/13/13 1622 05/13/13 2151 05/14/13 0621  GLUCAP 118* 210* 118*    Scheduled Meds: . allopurinol  100 mg Oral Daily  . aspirin EC  81 mg Oral Daily  . calcitRIOL  0.25 mcg Oral Daily  . clopidogrel  75 mg Oral Daily  . furosemide  80 mg Intravenous Q8H  . insulin aspart  0-15 Units Subcutaneous TID WC  . insulin aspart protamine- aspart  8 Units Subcutaneous BID WC  . simvastatin  10 mg Oral q1800  . sodium chloride  3 mL Intravenous Q12H  . timolol  1 drop Both Eyes BID    Continuous Infusions: . heparin 1,000 Units/hr (05/13/13 1507)    Past Medical History  Diagnosis Date  . Colon polyps     hyperplastic  . Osteoarthritis   . Hyperlipidemia   . CAD (coronary artery disease)   . Anemia   . Glaucoma   . Macular degeneration   .  Gout   . Chronic kidney disease (CKD), stage IV (severe)   . Diabetes mellitus type 2 with complications 05/13/2013  . Hypertensive heart disease 05/13/2013  . Atherosclerotic peripheral vascular disease with ulceration   . Myocardial infarction   . Shortness of breath   . Diabetes mellitus without complication   . CHF (congestive heart failure)     Past Surgical History  Procedure Laterality Date  . Bilateral renal artery stents    . Carotid endarterectomy Right   . Coronary artery bypass graft  1993     LIMA to LAD-Dx, SVG to OM-circ, SVG to AM-PD-PL 12/27/91 [facility]  . Cataract extraction      left  . Hernia repair      Clarene Duke RD, LDN Pager [phone] After Hours pager [phone]

## 2013-05-14 NOTE — Progress Notes (Signed)
ANTICOAGULATION CONSULT NOTE Pharmacy Consult for [facility] Indication: r/o NSTEMI  No Known Allergies  Patient Measurements: Height: 6' (182.9 cm) Weight: 147 lb 0.8 oz (66.7 kg) (bed ) IBW/kg (Calculated) : 77.6 Heparin Dosing Weight: 73kg  Vital Signs: Temp: 97.5 F (36.4 C) (06/17 0533) Temp src: Oral (06/17 0533) BP: 108/58 mmHg (06/17 0808) Pulse Rate: 95 (06/17 0808)  Labs:  Recent Labs  05/13/13 1222 05/13/13 1334 05/13/13 1425 05/13/13 1658 05/13/13 1703 05/13/13 2237 05/14/13 0525  HGB  --  11.1*  --   --   --   --  10.5*  HCT  --  34.0*  --   --   --   --  32.7*  PLT  --  228  --   --   --   --  222  APTT  --   --   --  184*  --   --   --   LABPROT  --   --   --  17.1*  --   --   --   INR  --   --   --  1.43  --   --   --   HEPARINUNFRC  --   --   --   --   --  0.58 0.71*  CREATININE 3.46*  --   --  3.59*  --   --  3.54*  TROPONINI  --   --  3.34*  --  5.05* 4.69*  --     Estimated Creatinine Clearance: 14.9 ml/min (by C-G formula based on Cr of 3.54).  Assessment: 79 y/o male with CHF/possible ACS on heparin per pharmacy.  Am HL 0.71 on 1000 units/hr.  HL at high end of desired goal.  Troponins are positive x 3.   Goal of Therapy:  Heparin level 0.3-0.7 units/ml Monitor platelets by anticoagulation protocol: Yes   Plan:  1. Decrease heparin drip to 950 units/hr 2. Check 8 hr HL 3. Daily HL and CBC Herby Abraham, Pharm.D. [phone] 05/14/2013 11:25 AM

## 2013-05-14 NOTE — Progress Notes (Signed)
Subjective:  Did not sleep well last night. Says the breathing is somewhat better. No chest pain today.  Objective:  Vital Signs in the last 24 hours: BP 103/54  Pulse 77  Temp(Src) 97.6 F (36.4 C) (Oral)  Resp 22  Ht 6' (1.829 m)  Wt 66.7 kg (147 lb 0.8 oz)  BMI 19.94 kg/m2  SpO2 97%  Physical Exam: Elderly white male in no acute distress Lungs:  Reduced breath sounds at bases Cardiac:  Regular rhythm, normal S1 and S2, no S3, 1-2/6 systolic murmur Abdomen:  Soft, nontender, no masses Extremities:  Trace edema present  Intake/Output from previous day: 06/16 0701 - 06/17 0700 In: 252 [P.O.:240; I.V.:12] Out: -   Weight Filed Weights   05/13/13 1630 05/14/13 0500 05/14/13 0533  Weight: 66.8 kg (147 lb 4.3 oz) 66.7 kg (147 lb 0.8 oz) 66.7 kg (147 lb 0.8 oz)    Lab Results: Basic Metabolic Panel:  Recent Labs  14/78/29 1658 05/14/13 0525  NA 141 139  K 4.3 3.9  CL 98 97  CO2 29 30  GLUCOSE 133* 131*  BUN 84* 83*  CREATININE 3.59* 3.54*   CBC:  Recent Labs  05/13/13 1334 05/14/13 0525  WBC 7.0 6.8  NEUTROABS 4.6  --   HGB 11.1* 10.5*  HCT 34.0* 32.7*  MCV 97.1 98.2  PLT 228 222   Cardiac Enzymes:  Recent Labs  05/13/13 1425 05/13/13 1703 05/13/13 2237  TROPONINI 3.34* 5.05* 4.69*   BNP    Component Value Date/Time   PROBNP 47817.0* 05/13/2013 1221   Telemetry: Sinus with left bundle branch block pattern  Assessment/Plan: 1. Acute congestive heart failure somewhat improved 2. 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 Anemia 4. Non-ST elevation myocardial infarction 5. Coronary artery disease with previous bypass grafting 6. Diabetes mellitus with multiple complications  Recommendations:  Continue intravenous Lasix and watch renal function. Because of incontinence his output is somewhat difficult to monitor. Await echo results. Stop intravenous heparin now.      Darden Palmer  MD [facility]  05/14/2013, 1:36 PM

## 2013-05-14 NOTE — Progress Notes (Signed)
Patient evaluated for community based chronic disease management services with [facility] Care Management Program as a benefit of patient's [employer]. Spoke with patient, his wife, and son at bedside to explain [facility] Care Management services. Son indicated that he is building an in Chief Technology Officer on to his home that will be completed in less than a month.  His is retiring in a few months and will actively manage their care and support.  They have declined services at this time.  Left contact information and [facility] literature at bedside. Made inpatient Case Manager aware that [facility] Care Management has consulted. Of note, [facility] Care Management services does not replace or interfere with any services that are arranged by inpatient case management or social work.  For additional questions or referrals please contact Anibal Henderson BSN RN [facility] Liaison at [phone].

## 2013-05-14 NOTE — Progress Notes (Signed)
Patient has numerous ecchymotic areas on chest, back, bilateral upper extremities, and bilateral lower extremities.  During assessment this am patient had no skin tears.  However, this afternoon when patient had some blood on gown and was found to have a new skin tear to area near collar bone on rt side.  Area was cleansed and dressed with pink foam bandage.  Also, a while later patient accidentally pulled out his IV.  Patient was bleeding from IV site pressure was applied with gauze, paper tape was used due to patient's thin, fragile skin.  Patient has very poor veins.  Therefore, IV team was called to put in new site.

## 2013-05-15 LAB — BASIC METABOLIC PANEL
CO2: 31 mEq/L (ref 19–32)
Chloride: 94 mEq/L — ABNORMAL LOW (ref 96–112)
Creatinine, Ser: 3.52 mg/dL — ABNORMAL HIGH (ref 0.50–1.35)
GFR calc Af Amer: 17 mL/min — ABNORMAL LOW (ref >90)
Potassium: 4 mEq/L (ref 3.5–5.1)
Sodium: 137 mEq/L (ref 135–145)

## 2013-05-15 LAB — GLUCOSE, CAPILLARY

## 2013-05-15 MED ORDER — ZOLPIDEM TARTRATE 5 MG PO TABS
5.0000 mg | ORAL_TABLET | Freq: Once | ORAL | Status: AC
  Administered 2013-05-15: 5 mg via ORAL
  Filled 2013-05-15: qty 1

## 2013-05-15 NOTE — Progress Notes (Signed)
Pharmacist Heart Failure Core Measure Documentation  Assessment: [name] has an EF documented as 25% on 05/14/13 by echo.  Rationale: Heart failure patients with left ventricular systolic dysfunction (LVSD) and an EF < 40% should be prescribed an angiotensin converting enzyme inhibitor (ACEI) or angiotensin receptor blocker (ARB) at discharge unless a contraindication is documented in the medical record.  This patient is not currently on an ACEI or ARB for HF.  This note is being placed in the record in order to provide documentation that a contraindication to the use of these agents is present for this encounter.  ACE Inhibitor or Angiotensin Receptor Blocker is contraindicated (specify all that apply)  []   ACEI allergy AND ARB allergy []   Angioedema []   Moderate or severe aortic stenosis []   Hyperkalemia []   Hypotension []   Renal artery stenosis [x]   Worsening renal function, preexisting renal disease or dysfunction   Severiano Gilbert 05/15/2013 2:40 PM

## 2013-05-15 NOTE — Progress Notes (Addendum)
Pt has been calling out "hey"  periodic  frequency today and according to wife he has been since admission. Wife states he only rarely does this at home. Pt states he is not in pain.  Son and wife are concerned over the increase in frequency.Page MD Donnie Aho and PCP who is off until Monday, was forward to MD on call for him. Left message with secretary  concerning this issue. Will continue to monitor.

## 2013-05-15 NOTE — Progress Notes (Signed)
Pt up in chair today

## 2013-05-15 NOTE — Clinical Documentation Improvement (Signed)
  DOCUMENTATION CLARIFICATIONS ARE NOT PART OF THE PERMANENT MEDICAL RECORD   Please update your documentation within the medical record to reflect your response to this query.                                                                                      05/15/13  Dr. Donnie Aho,  In a better effort to capture your patient's severity of illness, reflect appropriate length of stay and utilization of resources, a review of the patient medical record has revealed the following indicators:   "Acute congestive heart failure somewhat improved" Progress Notes signed by Othella Boyer, MD at 05/15/2013 9:50 AM    Echo 05/14/13 Study Conclusions - Left ventricle: The cavity size was normal. Systolic function was severely reduced. The estimated ejection fraction was in the range of 25% to 30%. Severe hypokinesis of the lateral and inferolateral myocardium. - Mitral valve: Moderately calcified annulus. Moderate to severe regurgitation. - Left atrium: The atrium was severely dilated.    Based on your clinical judgment, please clarify and document the TYPE of Heart Failure monitored and treated this admission in the progress notes and discharge summary.   In responding to this query please exercise your independent judgment.     The fact that a query is asked, does not imply that any particular answer is desired or expected.   Reviewed: additional documentation in the medical record  Thank You,  Jerral Ralph  RN BSN CCDS Certified Clinical Documentation Specialist: Cell   [phone]  Health Information Management [place]   TO RESPOND TO THE THIS QUERY, FOLLOW THE INSTRUCTIONS BELOW:  1. If needed, update documentation for the patient's encounter via the notes activity.  2. Access this query again and click edit on the In [employer].  3. After updating, or not, click F2 to complete all highlighted (required) fields concerning your review. Select "additional  documentation in the medical record" OR "no additional documentation provided".  4. Click Sign note button.  5. The deficiency will fall out of your In Basket *Please let us know if you are not able to complete this workflow by phone or e-mail (listed below).

## 2013-05-15 NOTE — Progress Notes (Signed)
Subjective:  Did not sleep well last night. Does not feel well.  Less SOB.  Very weak. ECHO shows EF 25-30% with inferolateral hypokinesis.c/o dry mouth.  Objective:  Vital Signs in the last 24 hours: BP 94/54  Pulse 79  Temp(Src) 97.7 F (36.5 C) (Oral)  Resp 18  Ht 6' (1.829 m)  Wt 64.955 kg (143 lb 3.2 oz)  BMI 19.42 kg/m2  SpO2 99%  Physical Exam: Elderly white male in no acute distress Lungs:  Reduced breath sounds at bases Cardiac:  Regular rhythm, normal S1 and S2, no S3, 1-2/6 systolic murmur Abdomen:  Soft, nontender, no masses Extremities:  Trace edema present  Intake/Output from previous day: 06/17 0701 - 06/18 0700 In: 843 [P.O.:840; I.V.:3] Out: 1950 [Urine:1950]  Weight Filed Weights   05/14/13 0500 05/14/13 0533 05/15/13 0521  Weight: 66.7 kg (147 lb 0.8 oz) 66.7 kg (147 lb 0.8 oz) 64.955 kg (143 lb 3.2 oz)    Lab Results: Basic Metabolic Panel:  Recent Labs  16/10/96 0525 05/15/13 0451  NA 139 137  K 3.9 4.0  CL 97 94*  CO2 30 31  GLUCOSE 131* 80  BUN 83* 89*  CREATININE 3.54* 3.52*   CBC:  Recent Labs  05/13/13 1334 05/14/13 0525  WBC 7.0 6.8  NEUTROABS 4.6  --   HGB 11.1* 10.5*  HCT 34.0* 32.7*  MCV 97.1 98.2  PLT 228 222   Cardiac Enzymes:  Recent Labs  05/13/13 1425 05/13/13 1703 05/13/13 2237  TROPONINI 3.34* 5.05* 4.69*   BNP    Component Value Date/Time   PROBNP 47817.0* 05/13/2013 1221   Telemetry: Sinus with left bundle branch block pattern  Assessment/Plan: 1. Acute congestive heart failure somewhat improved 2. 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 Anemia 4. Non-ST elevation myocardial infarction 5. Coronary artery disease with previous bypass grafting 6. Diabetes mellitus with multiple complications  Recommendations:  Continue intravenous Lasix  One more day.  Care management to see.  I would wonder whether Hospice might be of value and will speak to wife and son.  Suspece he has had graft occlusion with  MI prob in recent past.    W. Ashley Royalty  MD [facility]  05/15/2013, 9:47 AM

## 2013-05-15 NOTE — Progress Notes (Signed)
Pt weight up however, today's weight was standing 6-17 weight was bed. However weight standing on Admit 66.8kg  Today 6-18 69.95 kg

## 2013-05-15 NOTE — Care Management Note (Signed)
    Page 1 of 2   05/15/2013     2:36:48 PM   CARE MANAGEMENT NOTE 05/15/2013  Patient:  [name], [name]   Account Number:  [id number]  Date Initiated:  05/15/2013  Documentation initiated by:  Tera Mater  Subjective/Objective Assessment:   79yo male admitted with MI.  Pt. lives at home with spouse.     Action/Plan:   discharge planning   Anticipated DC Date:  05/16/2013   Anticipated DC Plan:  HOME W HOME HEALTH SERVICES      DC Planning Services  CM consult      [facility] Choice  HOME HEALTH   Choice offered to / List presented to:  C-3 Spouse        HH arranged  HH-2 PT  HH-3 OT  HH-1 RN  HH-10 DISEASE MANAGEMENT      HH agency  [facility]   Status of service:  In process, will continue to follow Medicare Important Message given?   (If response is "NO", the following Medicare IM given date fields will be blank) Date Medicare IM given:   Date Additional Medicare IM given:    Discharge Disposition:    Per UR Regulation:  Reviewed for med. necessity/level of care/duration of stay  If discussed at Long Length of Stay Meetings, dates discussed:    Comments:  05/15/13 1400 In to complete heart failure home health screen.  Pt.'s spouse stated she could not remember the name of the home health company that the pt. was using prior to admission.  This NCM TC to Dr. Jacky Kindle [phone]) to inquire of home health agency, and was told pt. was set up with [facility].  TC to [facility], with Genevieve Norlander, to give referral for [facility] PT/OT and [facility] RN for heart failure management.  Pt. still on IV lasix. Tera Mater, RN, BSN NCM [phone]

## 2013-05-15 NOTE — Progress Notes (Addendum)
Pt Son request sleep aid for Pt. Paged MD 2x. Family also states that Pt  appears to them to be afraid giving there interaction with Pt the last 2 days.

## 2013-05-15 NOTE — Progress Notes (Signed)
Encouraged  pt to set on side of bed to use urinal. Pt stated he want to lye in bed on use urinal  Wean Pt to 1l O2 pt stat 97%, we remove 02 and continue to monitor

## 2013-05-16 LAB — GLUCOSE, CAPILLARY
Glucose-Capillary: 122 mg/dL — ABNORMAL HIGH (ref 70–99)
Glucose-Capillary: 122 mg/dL — ABNORMAL HIGH (ref 70–99)
Glucose-Capillary: 43 mg/dL — CL (ref 70–99)
Glucose-Capillary: 82 mg/dL (ref 70–99)

## 2013-05-16 LAB — BASIC METABOLIC PANEL
BUN: 98 mg/dL — ABNORMAL HIGH (ref 6–23)
CO2: 32 mEq/L (ref 19–32)
GFR calc non Af Amer: 15 mL/min — ABNORMAL LOW (ref >90)
Glucose, Bld: 104 mg/dL — ABNORMAL HIGH (ref 70–99)
Potassium: 4.3 mEq/L (ref 3.5–5.1)

## 2013-05-16 MED ORDER — ZOLPIDEM TARTRATE 5 MG PO TABS: 5.0000 mg | ORAL_TABLET | Freq: Every evening | ORAL | Status: DC | PRN

## 2013-05-16 MED ORDER — FUROSEMIDE 80 MG PO TABS
80.0000 mg | ORAL_TABLET | Freq: Two times a day (BID) | ORAL | Status: DC
  Administered 2013-05-16 – 2013-05-17 (×2): 80 mg via ORAL
  Filled 2013-05-16 (×4): qty 1

## 2013-05-16 MED ORDER — LORAZEPAM 0.5 MG PO TABS
0.5000 mg | ORAL_TABLET | Freq: Four times a day (QID) | ORAL | Status: DC | PRN
  Administered 2013-05-16 (×3): 0.5 mg via ORAL
  Filled 2013-05-16 (×2): qty 1

## 2013-05-16 NOTE — Progress Notes (Signed)
CRITICAL VALUE ALERT  Critical value received: CBG 43  Date of notification:  05/16/13  Time of notification:  2130  Critical value read back:  Nurse who received alert:  Miyana Mordecai, RN  MD notified (1st page):  MD on call  Time of first page: 2200  MD notified (2nd page):  Time of second page:  Responding MD:    Time MD responded:

## 2013-05-16 NOTE — Progress Notes (Signed)
Blood sugar rechecked after intervention, at 2118H, BS at 86.  Will monitor and evaluate accordingly.

## 2013-05-16 NOTE — Progress Notes (Signed)
Notified by [name] CMRN, patient and family request services of [facility] of  [facility]) after discharge. Patient information reviewed with Dr Mikle Bosworth [facility] Medical Director, hospice eligible dx: Systolic Heart Failure Spoke at bedside with patient, wife [name] and son [name] to initiate education related to hospice services, philosophy and team approach to care- they voiced good understanding of information.  Per notes and discussion plan is for possible d/c tomorrow, Friday 6/20 - son feels patient will be able to transport by personal vehicle- they have 3 small steps to enter the home- he feels with his help the patient will be able to navigate this - patient will need travel O2 tank for transport home  Son and wife shared concerns related to patient's intermittent verbalizations "Hey; Hey" this behavior was observed 3-4 times during my meeting with family; -patient seems almost startled unclear if patient is actually asleep but appears to be waking and shouting- easily oriented and calmed; son an wife are concerned that this may escalate once home and are hopeful for discharge medication if needed - addition of Ativan 0.5 mg po prn to assist with transition to comfort at home was discussed with Dr Donnie Aho and Dr Julaine Fusi PMT; son also informed patient received Ambien last night for sleep and slept well - assured family Dr Donnie Aho will address these concerns with discharge orders     DME has been requested for delivery to the home later today: Complete package D which includes fully electric hospital bed with AP&P mattress and full rails; overbed table and add a 3n1 BSC; and Complete Oxygen Package B- patient currently on 2 LNC *Please call son [name] to arrange delivery c: [phone]  Dr Donnie Aho will be attending physician working with [facility]; pharmacy [facility] on [facility] Initial paperwork faxed to [facility]  Please notify [facility] when patient is ready to leave  unit at d/c call [phone] (or [phone] if after 5 pm);  [facility] information and contact numbers also given to wife [name] and son [name]  during visit.   Above information shared with [facility] Please call with any questions or concerns   Valente David, RN 05/16/2013, 12:22 PM [facility] Palliative Medicine Team RN Liaison [phone]

## 2013-05-16 NOTE — Progress Notes (Signed)
Subjective:  Patient yells out at times but does not appear to be in any acute distress. The family notes that he has been doing this at home. His breathing is better. He was given the name be in last night and slept somewhat better. Denies PND orthopnea. Wants to hang his legs over the bed.  Objective:  Vital Signs in the last 24 hours: BP 98/58  Pulse 87  Temp(Src) 97.5 F (36.4 C) (Oral)  Resp 18  Ht 6' (1.829 m)  Wt 64.864 kg (143 lb)  BMI 19.39 kg/m2  SpO2 98%  Physical Exam: Elderly white male in no acute distress, he is somnolent and even while sitting in the room will occasionally yell out "Hey" Lungs:  Reduced breath sounds at bases Cardiac:  Regular rhythm, normal S1 and S2, no S3, 1-2/6 systolic murmur Abdomen:  Soft, nontender, no masses Extremities:  Trace edema present  Intake/Output from previous day: 06/18 0701 - 06/19 0700 In: 1323 [P.O.:840; I.V.:3] Out: 1527 [Urine:1525; Stool:2]  Weight Filed Weights   05/14/13 0533 05/15/13 0521 05/16/13 0705  Weight: 66.7 kg (147 lb 0.8 oz) 64.955 kg (143 lb 3.2 oz) 64.864 kg (143 lb)    Lab Results: Basic Metabolic Panel:  Recent Labs  16/10/96 0451 05/16/13 0455  NA 137 138  K 4.0 4.3  CL 94* 92*  CO2 31 32  GLUCOSE 80 104*  BUN 89* 98*  CREATININE 3.52* 3.56*   CBC:  Recent Labs  05/13/13 1334 05/14/13 0525  WBC 7.0 6.8  NEUTROABS 4.6  --   HGB 11.1* 10.5*  HCT 34.0* 32.7*  MCV 97.1 98.2  PLT 228 222   Cardiac Enzymes:  Recent Labs  05/13/13 1425 05/13/13 1703 05/13/13 2237  TROPONINI 3.34* 5.05* 4.69*   BNP    Component Value Date/Time   PROBNP 47817.0* 05/13/2013 1221   Telemetry: Sinus with left bundle branch block pattern  Assessment/Plan: 1. Acute congestive heart failure improved but still chronic 2. 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 Anemia 4. Non-ST elevation myocardial infarction 5. Coronary artery disease with previous bypass grafting 6. Diabetes mellitus with  multiple complications  Recommendations:  Long discussion this morning with son and wife as well as patient. I suspect that he has had an interval myocardial infarction in the inferolateral distribution because of his reduced ejection fraction causing acute congestive heart failure. He is not a candidate for cardiac interventions. He is currently a DO NOT RESUSCITATE and dialysis is not planned.  I have recommended hospice outpatient for the patient. His prognosis is not good over the next 6 months. I would like for a hospice consultation and we'll possibly consider discharge in the morning. Plan to change over to oral diuretics today. Extensive discussion concerning end-of-life issues, goals of care, and spoke to social worker to discuss options for hospice and recommendations.   Darden Palmer  MD [facility]  05/16/2013, 9:31 AM

## 2013-05-16 NOTE — Progress Notes (Signed)
Ambulated  Pt w/ walker 432ft down hall way. Pt gait was mildly steady, walk fatigued Pt stop one to rest. Unable to get O2 stat d/t monitor not reading. Pt O2 stat at rest on  2L is 99%. Ambulated 41ft on RA O2 stat was 90% before monitor stop working.   PT was been on and off 2L at rest today. Stats on RA at 97% for a few hours then will drop to 90-92%.   Will continue to monitor

## 2013-05-16 NOTE — Progress Notes (Signed)
05/16/13 1130 In to speak with pt. and family about hospice choice.  Pt. Resting with eyes closed. This NCM gave spouse, and son Jillyn Hidden, hospice choice, and they chose [facility] [place].  The pt. will be dc back home to [address] [place].  Family stated pt. would need oxygen at home.  TC to [facility], with [facility], to give referral for hospice.  NCM will continue to follow for dc needs. Contact for son, Jillyn Hidden:  [phone]. Tera Mater, RN, BSN NCM [phone]

## 2013-05-16 NOTE — Progress Notes (Signed)
PAge MD for something to help pt reduce his calling out "hey" Md order Ativan.  Will continue to monitor

## 2013-05-16 NOTE — Progress Notes (Signed)
05/15/13 Pt. A/Ox3, he denied any c/o pain when asked. Pt.still yells out throughout the shift "Hey" but when asked if he needs anything or is in pain he denies. Pt.is able to call out for the urinal when needed most of the time but still has some occasional periods of incontinence. Condom cath placed on patient.Pt.c/o of having some SOB but oxygen saturation was at 93-94% room air, so pt.placed back on 2 L [place] for comfort. Oxygen saturation was 99 % on 2 L. Was reported by day shift RN that family visited on 05/15/13 and was concerned about pt.getting rest at night and pt.son said that he's taken Ambien before in the past without any adverse reaction. Asked patient last night if he would like something to help him sleep and he said "yes". Called MD on call and received order for Ambien mg po one time dose. Pt.sleep well throughout the shift with no signs of discomfort or distress.

## 2013-05-17 DIAGNOSIS — I214 Non-ST elevation (NSTEMI) myocardial infarction: Secondary | ICD-10-CM | POA: Diagnosis present

## 2013-05-17 DIAGNOSIS — E785 Hyperlipidemia, unspecified: Secondary | ICD-10-CM | POA: Insufficient documentation

## 2013-05-17 DIAGNOSIS — I5022 Chronic systolic (congestive) heart failure: Secondary | ICD-10-CM | POA: Diagnosis present

## 2013-05-17 LAB — GLUCOSE, CAPILLARY: Glucose-Capillary: 71 mg/dL (ref 70–99)

## 2013-05-17 MED ORDER — LORAZEPAM 0.5 MG PO TABS: 0.5000 mg | ORAL_TABLET | Freq: Three times a day (TID) | ORAL | Status: AC | PRN

## 2013-05-17 MED ORDER — CARVEDILOL 3.125 MG PO TABS: 3.1250 mg | ORAL_TABLET | Freq: Two times a day (BID) | ORAL | Status: AC

## 2013-05-17 MED ORDER — LORAZEPAM 0.5 MG PO TABS: 0.5000 mg | ORAL_TABLET | Freq: Three times a day (TID) | ORAL | Status: DC

## 2013-05-17 MED ORDER — ZOLPIDEM TARTRATE 5 MG PO TABS: 5.0000 mg | ORAL_TABLET | Freq: Every evening | ORAL | Status: AC | PRN

## 2013-05-17 MED ORDER — FUROSEMIDE 80 MG PO TABS: 80.0000 mg | ORAL_TABLET | Freq: Two times a day (BID) | ORAL | Status: AC

## 2013-05-17 MED ORDER — CARVEDILOL 3.125 MG PO TABS
3.1250 mg | ORAL_TABLET | Freq: Two times a day (BID) | ORAL | Status: DC
  Administered 2013-05-17: 3.125 mg via ORAL
  Filled 2013-05-17 (×3): qty 1

## 2013-05-17 NOTE — Progress Notes (Signed)
1150 DISCHARGE INSTRUCTIONS AND PRESCRIPTIONS GIVEN TO PT AND FAMILY VERBALIZED UNDERSTANDING  WHEELED TO   LOBBY  BY NT

## 2013-05-17 NOTE — Progress Notes (Signed)
Utilization Review Completed.   Zhoey Blackstock, RN, BSN Nurse Case Manager  [phone]  

## 2013-05-17 NOTE — Discharge Summary (Signed)
Physician Discharge Summary  Patient ID: [name] MRN: [id number] DOB/AGE: 04-13-1927 79 y.o.  Admit date: 05/13/2013 Discharge date: 05/17/2013  Primary Physician: Dr. Geoffry Paradise  Primary Discharge Diagnosis:  1. Acute on chronic systolic congestive heart failure  Secondary Discharge Diagnosis: 2. Non-ST elevation myocardial infarction-initial episode 3. 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 Renovascular hypertension with previous renal stent 5. 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 Hyperlipidemia under treatment 7. Peripheral vascular disease with previous ulceration 8. Urinary and fecal incontinence 9. Weight loss  Procedures:  2-D echocardiogram  Consults:  [facility] Course: This 79 year old male has a history of coronary artery disease with bypass grafting in 1993. He had aStent placed to the vein graft of the right coronary artery several years ago. He has developed stage IV chronic kidney disease and does not wish to have renal replacement therapy. He has diabetes mellitus with multiple complications and also has some depression. He has had recurrent ulcers on his feet as well as some infection.  He was just seen in May which point in time he was feeble but doing relatively well. Shortly after that he developed progressive dyspnea with exertion and saw his primary doctor who asked him to increase his furosemide from 20 mg to 40 mg daily. Despite this he has continued to have marked dyspnea with exertion and has some vague chest discomfort and was feeling quite poorly today and came to the emergency room. He had a markedly elevated BNP and mildly elevated troponin.Marland Kitchen He does not wish to have aggressive resuscitative measures. Of note he has lost about 20 pounds of weight recently. He has significant issues with depression. He has had significant difficulty sleeping with  PND and orthopnea.  The patient was initially placed in intravenous heparin. An echocardiogram done the next day showed significant left ventricular dysfunction with an ejection fraction of 25-30% and significant hypokinesis of the inferolateral wall. This was a change from his previous assessment of LV function and was felt to have represented a new interval microinfarction. The patient who received intravenous furosemide with improvement in his breathing. His BNP was markedly elevated on arrival. He clinically improved and was able to diurese approximately 4 pounds. He was noted to have intermittent episodes were he would yell out randomly and also had significant insomnia. He was placed on treatment with Ambien 5 mg at night. Extensive discussion was had with the family in light of the systolic heart failure. His long-term prognosis is poor and his age. I suggested hospice services at home since they do not wish resuscitation and are not planning on renal replacement therapy. He was seen by the hospice nurse and the family was agreeable to hospice arrangements were made for him to go home on hospice care. He will have a hospital bed delivered and have oxygen. He is sent home on a higher dose of Lasix as well as low-dose carvedilol. He is not a candidate for ACE inhibitors do to his renal insufficiency. I will see him in followup in one week.   Discharge Exam: Blood pressure 115/60, pulse 79, temperature 96.6 F (35.9 C), temperature source Oral, resp. rate 19, height 6' (1.829 m), weight 65.273 kg (143 lb 14.4 oz), SpO2 93.00%.   Patient was alert and oriented, lungs are relatively clear on discharge, no S3 noted.  Labs: CBC:   Lab Results  Component Value Date   WBC 6.8 05/14/2013  HGB 10.5* 05/14/2013   HCT 32.7* 05/14/2013   MCV 98.2 05/14/2013   PLT 222 05/14/2013    CMP:  Recent Labs Lab 05/13/13 1658  05/16/13 0455  NA 141  < > 138  K 4.3  < > 4.3  CL 98  < > 92*  CO2 29  < > 32   BUN 84*  < > 98*  CREATININE 3.59*  < > 3.56*  CALCIUM 8.9  < > 9.7  PROT 6.5  --   --   BILITOT 0.4  --   --   ALKPHOS 54  --   --   ALT 7  --   --   AST 28  --   --   GLUCOSE 133*  < > 104*  < > = values in this interval not displayed.   BNP (last 3 results)  Recent Labs  05/13/13 1221  PROBNP 47817.0*   Thyroid: Lab Results  Component Value Date   TSH 4.807* 05/13/2013    Hemoglobin A1C: Lab Results  Component Value Date   HGBA1C 5.6 05/13/2013     Radiology: Cardiomegaly, new pleural effusions and basilar atelectasis  EKG: Sinus rhythm with left bundle branch block pattern  Discharge Medications:   Medication List    TAKE these medications       acetaminophen 500 MG tablet  Commonly known as:  TYLENOL  Take 1,000 mg by mouth every 6 (six) hours as needed for pain.     allopurinol 100 MG tablet  Commonly known as:  ZYLOPRIM  Take 100 mg by mouth daily.     aspirin EC 81 MG tablet  Take 81 mg by mouth daily.     calcitRIOL 0.25 MCG capsule  Commonly known as:  ROCALTROL  Take 0.25 mcg by mouth daily.     carvedilol 3.125 MG tablet  Commonly known as:  COREG  Take 1 tablet (3.125 mg total) by mouth 2 (two) times daily with a meal.     clopidogrel 75 MG tablet  Commonly known as:  PLAVIX  Take 75 mg by mouth daily.     furosemide 80 MG tablet  Commonly known as:  LASIX  Take 1 tablet (80 mg total) by mouth 2 (two) times daily.     insulin aspart protamine- aspart (70-30) 100 UNIT/ML injection  Commonly known as:  NOVOLOG MIX 70/30  Inject 8 Units into the skin 2 (two) times daily with a meal.     LORazepam 0.5 MG tablet  Commonly known as:  ATIVAN  Take 1 tablet (0.5 mg total) by mouth every 8 (eight) hours.     pravastatin 40 MG tablet  Commonly known as:  PRAVACHOL  Take 40 mg by mouth daily.     timolol 0.5 % ophthalmic solution  Commonly known as:  BETIMOL  Place 1 drop into both eyes 2 (two) times daily.     zolpidem 5 MG  tablet  Commonly known as:  AMBIEN  Take 1 tablet (5 mg total) by mouth at bedtime as needed for sleep.       Followup plans and appointments: The patient is to be followed by hospice and palliative care service of [place]. He will see me in followup in one week and will see Dr. Jacky Kindle for his regularly scheduled appointment.  Time spent with patient to include physician time: 45 minutes  Signed: W. Ashley Royalty. MD [facility] 05/17/2013, 8:22 AM

## 2013-11-28 DEATH — deceased

## 2013-11-29 ENCOUNTER — Telehealth: Payer: Self-pay

## 2013-11-29 NOTE — Telephone Encounter (Signed)
Patient past away per Obituary in [place] [employer] °

## 2015-06-16 IMAGING — CR DG CHEST 2V
1 series · 1 of 1 positions shown · non-contrast
Comparison: 09/02/2009.

CLINICAL DATA: Shortness of breath and weakness.

CHEST - 2 VIEW

[view not recorded]
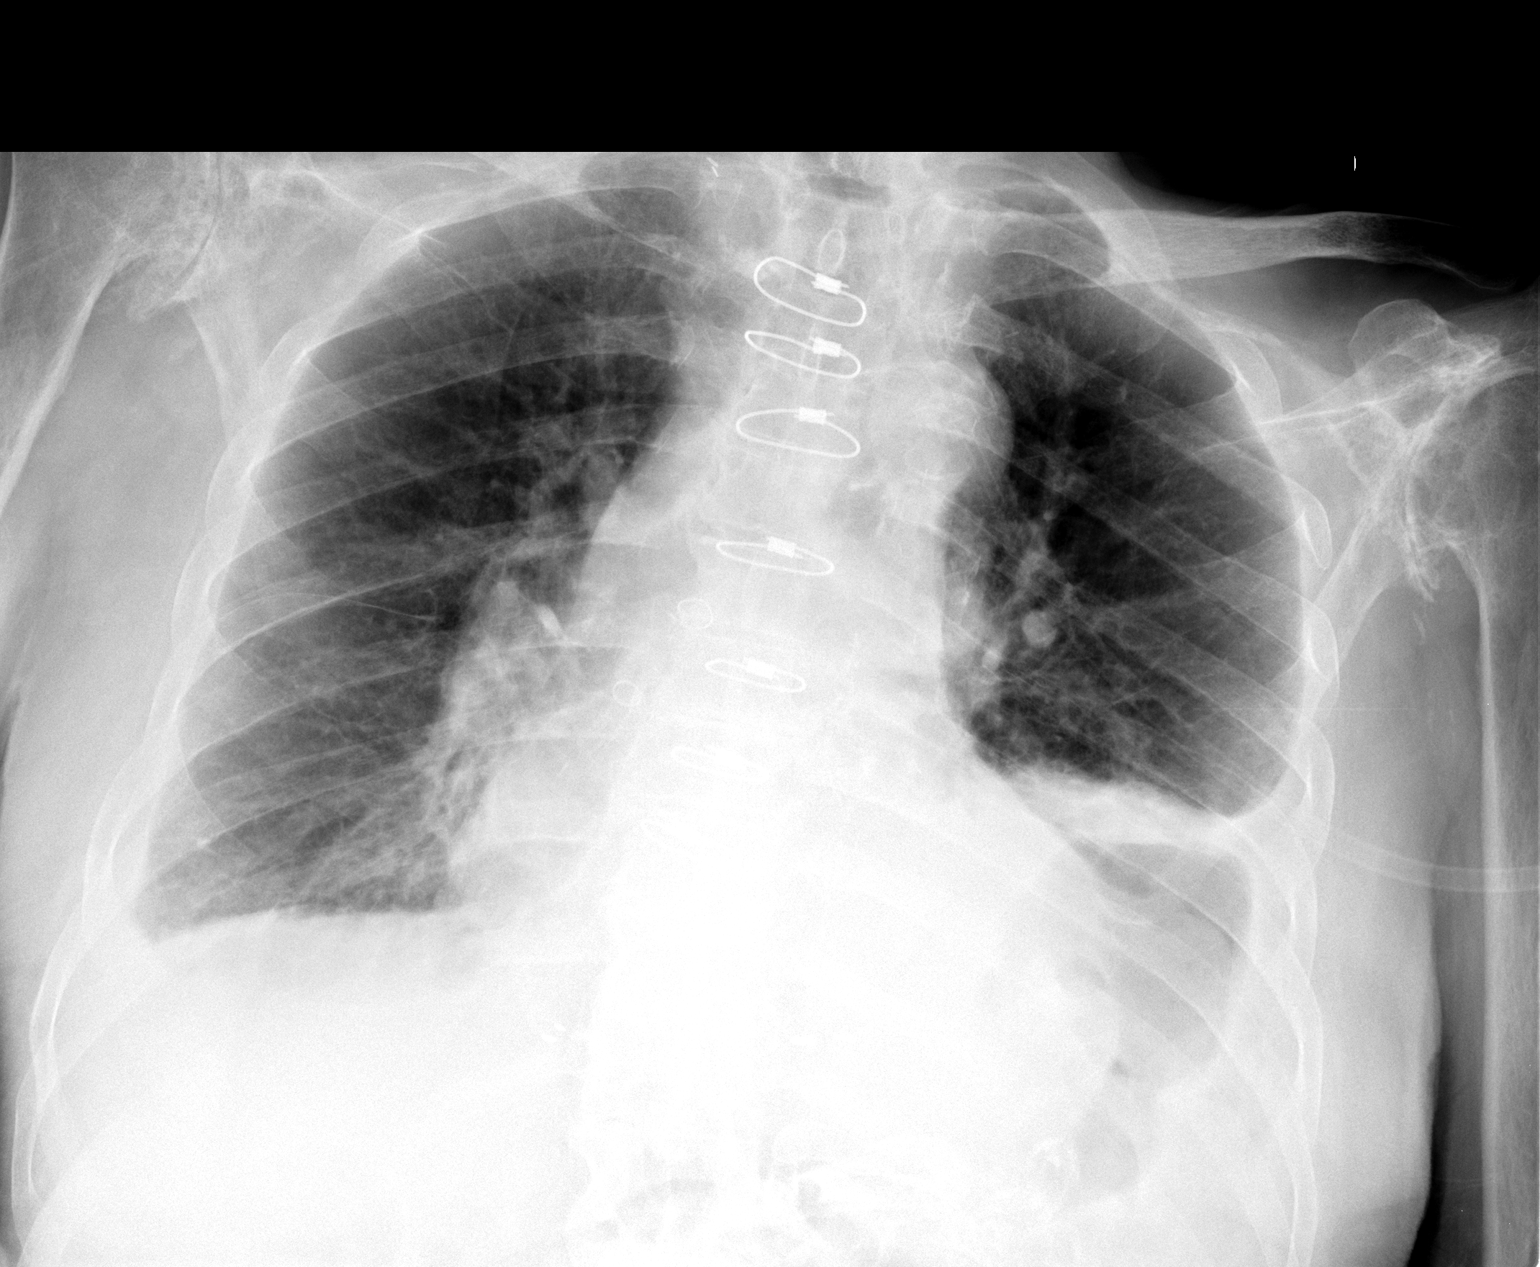

[1 of 1 positions shown; findings below may reference images not displayed]

FINDINGS: There is stable chronic elevation of the left
hemidiaphragm status post median sternotomy and CABG.  The patient
has developed small bilateral pleural effusions with associated
bibasilar atelectasis.  There is no overt pulmonary edema or
confluent airspace opacity.  There is no pneumothorax.  Advanced
glenohumeral degenerative changes are present bilaterally.
IMPRESSION: 1.  New pleural effusions and bibasilar atelectasis.  No evidence
of overt congestive heart failure.
2.  Advanced glenohumeral degenerative changes bilaterally.
